# Patient Record
Sex: Female | Born: 1990 | Race: Black or African American | Hispanic: No | Smoking: Never smoker
Health system: Southern US, Community
[De-identification: ages and names within clinical notes are randomized; demographics above are authoritative.]

## PROBLEM LIST (undated history)

## (undated) DIAGNOSIS — D649 Anemia, unspecified: Secondary | ICD-10-CM

## (undated) DIAGNOSIS — R87619 Unspecified abnormal cytological findings in specimens from cervix uteri: Secondary | ICD-10-CM

## (undated) DIAGNOSIS — F32A Depression, unspecified: Secondary | ICD-10-CM

## (undated) DIAGNOSIS — A749 Chlamydial infection, unspecified: Secondary | ICD-10-CM

## (undated) DIAGNOSIS — F329 Major depressive disorder, single episode, unspecified: Secondary | ICD-10-CM

## (undated) DIAGNOSIS — N72 Inflammatory disease of cervix uteri: Secondary | ICD-10-CM

## (undated) DIAGNOSIS — N946 Dysmenorrhea, unspecified: Secondary | ICD-10-CM

## (undated) DIAGNOSIS — R011 Cardiac murmur, unspecified: Secondary | ICD-10-CM

## (undated) DIAGNOSIS — F419 Anxiety disorder, unspecified: Secondary | ICD-10-CM

## (undated) HISTORY — DX: Depression, unspecified: F32.A

## (undated) HISTORY — DX: Major depressive disorder, single episode, unspecified: F32.9

## (undated) HISTORY — PX: OTHER SURGICAL HISTORY: SHX169

## (undated) HISTORY — DX: Unspecified abnormal cytological findings in specimens from cervix uteri: R87.619

## (undated) HISTORY — PX: LEEP: SHX91

## (undated) HISTORY — DX: Anxiety disorder, unspecified: F41.9

---
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
## 2010-03-31 HISTORY — PX: WISDOM TOOTH EXTRACTION: SHX21

## 2010-06-18 DIAGNOSIS — N946 Dysmenorrhea, unspecified: Secondary | ICD-10-CM | POA: Insufficient documentation

## 2010-06-20 DIAGNOSIS — Z8639 Personal history of other endocrine, nutritional and metabolic disease: Secondary | ICD-10-CM | POA: Insufficient documentation

## 2011-11-10 ENCOUNTER — Inpatient Hospital Stay (HOSPITAL_COMMUNITY)
Admission: AD | Admit: 2011-11-10 | Discharge: 2011-11-10 | Disposition: A | Discharge status: Home / Self Care | Payer: Self-pay | Source: Ambulatory Visit | Attending: Obstetrics and Gynecology | Admitting: Obstetrics and Gynecology

## 2011-11-10 ENCOUNTER — Encounter (HOSPITAL_COMMUNITY): Payer: Self-pay | Admitting: *Deleted

## 2011-11-10 DIAGNOSIS — N939 Abnormal uterine and vaginal bleeding, unspecified: Secondary | ICD-10-CM

## 2011-11-10 DIAGNOSIS — N949 Unspecified condition associated with female genital organs and menstrual cycle: Secondary | ICD-10-CM | POA: Insufficient documentation

## 2011-11-10 DIAGNOSIS — N72 Inflammatory disease of cervix uteri: Secondary | ICD-10-CM | POA: Insufficient documentation

## 2011-11-10 DIAGNOSIS — N938 Other specified abnormal uterine and vaginal bleeding: Secondary | ICD-10-CM | POA: Insufficient documentation

## 2011-11-10 HISTORY — DX: Chlamydial infection, unspecified: A74.9

## 2011-11-10 HISTORY — DX: Cardiac murmur, unspecified: R01.1

## 2011-11-10 LAB — URINALYSIS, ROUTINE W REFLEX MICROSCOPIC
Bilirubin Urine: NEGATIVE
Hgb urine dipstick: NEGATIVE
Ketones, ur: NEGATIVE mg/dL
Protein, ur: NEGATIVE mg/dL
Urobilinogen, UA: 0.2 mg/dL (ref 0.0–1.0)

## 2011-11-10 LAB — WET PREP, GENITAL
Trich, Wet Prep: NONE SEEN
Yeast Wet Prep HPF POC: NONE SEEN

## 2011-11-10 MED ORDER — AZITHROMYCIN 250 MG PO TABS
1000.0000 mg | ORAL_TABLET | Freq: Once | ORAL | Status: AC
  Administered 2011-11-10: 1000 mg via ORAL
  Filled 2011-11-10: qty 4

## 2011-11-10 NOTE — MAU Provider Note (Signed)
History     CSN: [id number]  Arrival date and time: 11/10/11 0856   None     Chief Complaint  Patient presents with  . Vaginal Bleeding   HPI Connie Guzman is a 21 y.o. female who presents to MAU with vaginal bleeding. The bleeding started this morning after intercourse. Patient state intercourse was not painful. She describes the bleeding as light, a streak of blood. Associated symptoms include lower abdominal cramping and nausea.  Last pap smear one year ago and normal. LMP 11/05/11. Current sex partner x 3 years. No birth control. Hx of chlamydia.  OB History    Grav Para Term Preterm Abortions TAB SAB Ect Mult Living                  Past Medical History  Diagnosis Date  . Heart murmur   . Chlamydia     Past Surgical History  Procedure Date  . Wisdom tooth extraction 2012  . Tonsillectomy and adenoidectomy 2009  . Tubes in ears     Family History  Problem Relation Age of Onset  . Other Neg Hx     History  Substance Use Topics  . Smoking status: Never Smoker   . Smokeless tobacco: Not on file  . Alcohol Use: 0.6 oz/week    1 Glasses of wine per week     last time about 1 week ago    Allergies:  Allergies  Allergen Reactions  . Chocolate Other (See Comments)    Throat swells and gets scratchy.  Face breaks out in pimples    No prescriptions prior to admission    Review of Systems  Constitutional: Negative for fever, chills and weight loss.  HENT: Negative for ear pain, nosebleeds, congestion, sore throat and neck pain.   Eyes: Negative for blurred vision, double vision, photophobia and pain.  Respiratory: Negative for cough, shortness of breath and wheezing.   Cardiovascular: Negative for chest pain, palpitations and leg swelling.  Gastrointestinal: Positive for nausea, abdominal pain (cramping) and constipation. Negative for heartburn, vomiting and diarrhea.  Genitourinary: Negative for dysuria, urgency and frequency.  Musculoskeletal: Positive for  back pain. Negative for myalgias.  Skin: Negative for itching and rash.  Neurological: Negative for dizziness, sensory change, speech change, seizures, weakness and headaches.  Endo/Heme/Allergies: Does not bruise/bleed easily.  Psychiatric/Behavioral: Negative for depression. The patient is nervous/anxious (hx of anxiety and depression , no medications). The patient does not have insomnia.    Physical Exam   Blood pressure 114/70, pulse 77, temperature 98.5 F (36.9 C), temperature source Oral, resp. rate 18, height 5\' 6"  (1.676 m), weight 120 lb (54.432 kg), last menstrual period 11/05/2011.  Physical Exam  Constitutional: She is oriented to person, place, and time. She appears well-developed and well-nourished. No distress.  HENT:  Head: Normocephalic and atraumatic.  Eyes: EOM are normal.  Neck: Neck supple.  Cardiovascular: Normal rate.   Respiratory: Effort normal.  GI: Soft.       Minimal tenderness lower abdomen with deep palpation. No guarding or rebound.  Genitourinary:       External genitalia without lesions. Scant blood vaginal vault. Cervix inflamed, positive CMT, no adnexal tenderness or mass palpated. Uterus without palpable enlargement.  Musculoskeletal: Normal range of motion.  Neurological: She is alert and oriented to person, place, and time.  Skin: Skin is warm and dry.  Psychiatric: She has a normal mood and affect. Her behavior is normal. Judgment and thought content normal.   Results  for orders placed during the hospital encounter of 11/10/11 (from the past 24 hour(s))  URINALYSIS, ROUTINE W REFLEX MICROSCOPIC     Status: Normal   Collection Time   11/10/11  9:21 AM      Component Value Range   Color, Urine YELLOW  YELLOW   APPearance CLEAR  CLEAR   Specific Gravity, Urine 1.025  1.005 - 1.030   pH 6.0  5.0 - 8.0   Glucose, UA NEGATIVE  NEGATIVE mg/dL   Hgb urine dipstick NEGATIVE  NEGATIVE   Bilirubin Urine NEGATIVE  NEGATIVE   Ketones, ur NEGATIVE   NEGATIVE mg/dL   Protein, ur NEGATIVE  NEGATIVE mg/dL   Urobilinogen, UA 0.2  0.0 - 1.0 mg/dL   Nitrite NEGATIVE  NEGATIVE   Leukocytes, UA NEGATIVE  NEGATIVE  POCT PREGNANCY, URINE     Status: Normal   Collection Time   11/10/11  9:31 AM      Component Value Range   Preg Test, Ur NEGATIVE  NEGATIVE  WET PREP, GENITAL     Status: Abnormal   Collection Time   11/10/11 10:10 AM      Component Value Range   Yeast Wet Prep HPF POC NONE SEEN  NONE SEEN   Trich, Wet Prep NONE SEEN  NONE SEEN   Clue Cells Wet Prep HPF POC FEW (*) NONE SEEN   WBC, Wet Prep HPF POC TOO NUMEROUS TO COUNT (*) NONE SEEN   MAU Course  Procedures Assessment: 21 y.o. female with abnormal vaginal bleeding   Cervicitis  Plan:  Zithromax 1 gram po   Follow up with [facility], return here as needed.  Medication List  As of 11/10/2011  7:02 PM   CONTINUE taking these medications         ibuprofen 200 MG tablet   Commonly known as: ADVIL,MOTRIN      naproxen 250 MG tablet   Commonly known as: NAPROSYN           Follow-up Information    Call [facility] [place].   Contact information:   [address] [facility] [address]         I have reviewed this patient's vital signs, nurses notes and appropriate labs. I have discussed results with the patient, treatment and follow up plan of care. Patient voices understanding.  NEESE,HOPE, RN, FNP, [facility] 11/10/2011, 10:11 AM

## 2011-11-10 NOTE — MAU Note (Signed)
Has a lot of concerns with her body.  Period ended 2 days. Had sex this morning, was a streak of bright red blood on the sheet, about 5 min later became nausea.  Period has been lighter and shorter and not as much cramping as usual.

## 2011-11-10 NOTE — MAU Note (Signed)
Pt states she just finished her menses on Sunday which lasted 4 days.  She got concerned because she started having some vaginal bleeding this morning at 0800.

## 2011-11-11 NOTE — MAU Provider Note (Signed)
Agree with above note.  Connie Guzman 11/11/2011 7:52 AM

## 2011-11-13 ENCOUNTER — Telehealth: Payer: Self-pay | Admitting: *Deleted

## 2011-11-13 ENCOUNTER — Other Ambulatory Visit: Payer: Self-pay | Admitting: Obstetrics and Gynecology

## 2011-11-13 MED ORDER — AZITHROMYCIN 500 MG PO TABS: 1000.0000 mg | ORAL_TABLET | Freq: Once | ORAL | Status: AC

## 2011-11-13 NOTE — Telephone Encounter (Signed)
Message copied by Pennie Banter on Thu Nov 13, 2011  4:47 PM ------      Message from: CONSTANT, Gigi Gin      Created: Thu Nov 13, 2011  9:32 AM      Regarding: STD       Please inform patient of positive chlamydia results and that azithromycin has been e-prescribed. Patient needs to follow-up with health department and inform partner that treatment is needed            [facility]

## 2011-11-13 NOTE — Telephone Encounter (Signed)
Telephone call to patient regarding positive chlamydia culture, patient notified.  Instructed patient to pick up Rx for treatment.  Instructed patient to notify her partner for treatment.  Instructed patient to abstain from sex for seven days after treatment.  Report of treatment faxed to health department.

## 2012-04-24 ENCOUNTER — Emergency Department (HOSPITAL_COMMUNITY)
Admission: EM | Admit: 2012-04-24 | Discharge: 2012-04-24 | Disposition: A | Discharge status: Home / Self Care | Payer: Self-pay | Attending: Emergency Medicine | Admitting: Emergency Medicine

## 2012-04-24 ENCOUNTER — Encounter (HOSPITAL_COMMUNITY): Payer: Self-pay | Admitting: Emergency Medicine

## 2012-04-24 DIAGNOSIS — N72 Inflammatory disease of cervix uteri: Secondary | ICD-10-CM | POA: Insufficient documentation

## 2012-04-24 DIAGNOSIS — N390 Urinary tract infection, site not specified: Secondary | ICD-10-CM | POA: Insufficient documentation

## 2012-04-24 DIAGNOSIS — N760 Acute vaginitis: Secondary | ICD-10-CM | POA: Insufficient documentation

## 2012-04-24 DIAGNOSIS — Z3202 Encounter for pregnancy test, result negative: Secondary | ICD-10-CM | POA: Insufficient documentation

## 2012-04-24 DIAGNOSIS — B9689 Other specified bacterial agents as the cause of diseases classified elsewhere: Secondary | ICD-10-CM

## 2012-04-24 DIAGNOSIS — N898 Other specified noninflammatory disorders of vagina: Secondary | ICD-10-CM | POA: Insufficient documentation

## 2012-04-24 DIAGNOSIS — R011 Cardiac murmur, unspecified: Secondary | ICD-10-CM | POA: Insufficient documentation

## 2012-04-24 DIAGNOSIS — Z79899 Other long term (current) drug therapy: Secondary | ICD-10-CM | POA: Insufficient documentation

## 2012-04-24 DIAGNOSIS — R110 Nausea: Secondary | ICD-10-CM | POA: Insufficient documentation

## 2012-04-24 DIAGNOSIS — Z8619 Personal history of other infectious and parasitic diseases: Secondary | ICD-10-CM | POA: Insufficient documentation

## 2012-04-24 DIAGNOSIS — N949 Unspecified condition associated with female genital organs and menstrual cycle: Secondary | ICD-10-CM | POA: Insufficient documentation

## 2012-04-24 DIAGNOSIS — Z8742 Personal history of other diseases of the female genital tract: Secondary | ICD-10-CM | POA: Insufficient documentation

## 2012-04-24 DIAGNOSIS — R42 Dizziness and giddiness: Secondary | ICD-10-CM | POA: Insufficient documentation

## 2012-04-24 HISTORY — DX: Inflammatory disease of cervix uteri: N72

## 2012-04-24 LAB — URINALYSIS, ROUTINE W REFLEX MICROSCOPIC
Ketones, ur: NEGATIVE mg/dL
Leukocytes, UA: NEGATIVE
Protein, ur: NEGATIVE mg/dL
Urobilinogen, UA: 0.2 mg/dL (ref 0.0–1.0)

## 2012-04-24 LAB — WET PREP, GENITAL

## 2012-04-24 LAB — URINE MICROSCOPIC-ADD ON

## 2012-04-24 LAB — POCT PREGNANCY, URINE: Preg Test, Ur: NEGATIVE

## 2012-04-24 MED ORDER — HYDROCODONE-ACETAMINOPHEN 5-325 MG PO TABS
1.0000 | ORAL_TABLET | Freq: Once | ORAL | Status: AC
  Administered 2012-04-24: 1 via ORAL
  Filled 2012-04-24: qty 1

## 2012-04-24 MED ORDER — NITROFURANTOIN MONOHYD MACRO 100 MG PO CAPS: 100.0000 mg | ORAL_CAPSULE | Freq: Two times a day (BID) | ORAL | Status: DC

## 2012-04-24 MED ORDER — METRONIDAZOLE 500 MG PO TABS: 500.0000 mg | ORAL_TABLET | Freq: Two times a day (BID) | ORAL | Status: DC

## 2012-04-24 NOTE — ED Provider Notes (Signed)
Medical screening examination/treatment/procedure(s) were performed by non-physician practitioner and as supervising physician I was immediately available for consultation/collaboration.   Loren Racer, MD 04/24/12 [phone]

## 2012-04-24 NOTE — ED Notes (Signed)
Family at bedside. 

## 2012-04-24 NOTE — ED Provider Notes (Signed)
Pelvic exam: VAGINA: normal appearing vagina with normal color and discharge, no lesions, vaginal discharge - white, curd-like and odorless, CERVIX: cervical discharge present - white, curd-like and thick, cervical motion tenderness absent, ADNEXA: tenderness right, mild, no masses, exam chaperoned by technician.    Jaci Carrel, [place] 04/24/12 2035

## 2012-04-24 NOTE — ED Notes (Addendum)
Patient complaining of intermittent abdominal cramping for the past two weeks; cramping worsens during night and after sexual activity.  Patient has recently had pregnancy test done -- was told by PCP to take another one in a week.  LMP 03/10/12.  Patient denies abnormal vaginal discharge.  Complaining of nausea and dizziness that started today.

## 2012-04-24 NOTE — ED Provider Notes (Signed)
History     CSN: [id number]  Arrival date & time 04/24/12  1308   First MD Initiated Contact with Patient 04/24/12 1939      Chief Complaint  Patient presents with  . Abdominal Cramping    (Consider location/radiation/quality/duration/timing/severity/associated sxs/prior treatment) HPI Comments: 22 y/o F h/o chlamydia p/w suprapubic cramping and vaginal discharge. 2 weeks. Progressively worsening. Some nausea today. Brief LH with pain. Resolved.  Patient is a 22 y.o. female presenting with cramps.  Abdominal Cramping The primary symptoms of the illness include abdominal pain, nausea and vaginal discharge. The primary symptoms of the illness do not include fever, fatigue, shortness of breath, vomiting, diarrhea, hematochezia, dysuria or vaginal bleeding. The current episode started more than 2 days ago. The onset of the illness was gradual. The problem has been gradually worsening.  The abdominal pain began more than 2 days ago. The pain came on gradually. The abdominal pain has been gradually worsening since its onset. The abdominal pain is located in the suprapubic region. The abdominal pain does not radiate. Pain scale: moderate. The abdominal pain is relieved by nothing.  The vaginal discharge was first noticed more than 2 days ago. The patient believes that the vaginal discharge is worsening since it began. The vaginal discharge is not associated with dysuria.  Symptoms associated with the illness do not include chills, hematuria or back pain.    Past Medical History  Diagnosis Date  . Heart murmur   . Chlamydia   . Inflammation of cervix     Past Surgical History  Procedure Date  . Wisdom tooth extraction 2012  . Tonsillectomy and adenoidectomy 2009  . Tubes in ears     Family History  Problem Relation Age of Onset  . Other Neg Hx     History  Substance Use Topics  . Smoking status: Never Smoker   . Smokeless tobacco: Not on file  . Alcohol Use: 0.6 oz/week    1  Glasses of wine per week     Comment: last time about 1 week ago    OB History    Grav Para Term Preterm Abortions TAB SAB Ect Mult Living                  Review of Systems  Constitutional: Negative for fever, chills and fatigue.  HENT: Negative for congestion and rhinorrhea.   Eyes: Negative for pain and visual disturbance.  Respiratory: Negative for cough and shortness of breath.   Cardiovascular: Negative for chest pain and leg swelling.  Gastrointestinal: Positive for nausea and abdominal pain. Negative for vomiting, diarrhea and hematochezia.  Genitourinary: Positive for vaginal discharge and pelvic pain. Negative for dysuria, hematuria, flank pain, vaginal bleeding and difficulty urinating.  Musculoskeletal: Negative for back pain.  Skin: Negative for color change and rash.  Neurological: Positive for light-headedness. Negative for dizziness and headaches.  All other systems reviewed and are negative.    Allergies  Chocolate  Home Medications   Current Outpatient Rx  Name  Route  Sig  Dispense  Refill  . ACETAMINOPHEN 500 MG PO CHEW   Oral   Chew 500 mg by mouth every 6 (six) hours as needed. For pain         . IBUPROFEN 200 MG PO TABS   Oral   Take 200 mg by mouth every 6 (six) hours as needed. For cramps.         Marland Kitchen NAPROXEN 250 MG PO TABS   Oral  Take 250 mg by mouth daily as needed. For cramps.         Marland Kitchen METRONIDAZOLE 500 MG PO TABS   Oral   Take 1 tablet (500 mg total) by mouth 2 (two) times daily.   14 tablet   0   . NITROFURANTOIN MONOHYD MACRO 100 MG PO CAPS   Oral   Take 1 capsule (100 mg total) by mouth 2 (two) times daily.   10 capsule   0     BP 110/42  Pulse 81  Temp 98.4 F (36.9 C) (Oral)  Resp 16  SpO2 99%  LMP 03/10/2012  Physical Exam  Nursing note and vitals reviewed. Constitutional: She is oriented to person, place, and time. She appears well-developed and well-nourished. No distress.  HENT:  Head: Normocephalic  and atraumatic.  Eyes: Conjunctivae normal and EOM are normal. Pupils are equal, round, and reactive to light. Right eye exhibits no discharge. Left eye exhibits no discharge.  Neck: No tracheal deviation present.  Cardiovascular: Normal heart sounds and intact distal pulses.   Pulmonary/Chest: Effort normal and breath sounds normal. No stridor. No respiratory distress. She has no wheezes. She has no rales.  Abdominal: Soft. She exhibits no distension. There is tenderness (minimal suprapubic tenderness). There is no guarding.  Musculoskeletal: She exhibits no edema and no tenderness.  Neurological: She is alert and oriented to person, place, and time. She has normal strength. No cranial nerve deficit or sensory deficit. GCS eye subscore is 4. GCS verbal subscore is 5. GCS motor subscore is 6.  Skin: Skin is warm and dry.  Psychiatric: She has a normal mood and affect. Her behavior is normal.    ED Course  Procedures (including critical care time)  Labs Reviewed  URINALYSIS, ROUTINE W REFLEX MICROSCOPIC - Abnormal; Notable for the following:    APPearance CLOUDY (*)     Nitrite POSITIVE (*)     All other components within normal limits  WET PREP, GENITAL - Abnormal; Notable for the following:    Clue Cells Wet Prep HPF POC FEW (*)     WBC, Wet Prep HPF POC MODERATE (*)     All other components within normal limits  URINE MICROSCOPIC-ADD ON - Abnormal; Notable for the following:    Squamous Epithelial / LPF FEW (*)     Bacteria, UA MANY (*)     All other components within normal limits  POCT PREGNANCY, URINE  GC/CHLAMYDIA PROBE AMP   No results found.   1. UTI (lower urinary tract infection)   2. Bacterial vaginosis       MDM    22 y/o F with lower abd cramping. Onset 2 weeks ago. No fever HDS Labs as above. C/w BV and UTI Treat with flagyl and macrobid.  Doubt Appendicitis, Bowel Obstruction, AAA, Pyelonephritis, Nephrolithiasis, Pancreatitis, Cholecystitis, Shingles,  Perforated Bowel or Ulcer, Diverticulosis/itis, Ischemic Mesentery, Inflammatory Bowel Disease, Strangulated/Incarcerated Hernia,  PID, Intrauterine Pregnancy, Ectopic Pregnancy, Ovarian Torsion, Tubal Ovarian Abscess, STD, or other abdominal emergency as this would be inconsistent with history and physical, low risk, other diagnosis much more likely.   Dc home, return precautions given, follow up with primary care provider, patient in agreement with plan.   Labs and imaging reviewed by myself and considered in medical decision making if ordered. Imaging interpreted by radiology.   Discussed case with Dr. Ranae Palms who is in agreement with assessment and plan.       Stevie Kern, MD 04/24/12 [phone]

## 2012-04-24 NOTE — ED Provider Notes (Signed)
I saw and evaluated the patient, reviewed the resident's note and I agree with the findings and plan.   Loren Racer, MD 04/24/12 [phone]

## 2012-04-27 ENCOUNTER — Encounter (HOSPITAL_COMMUNITY): Payer: Self-pay | Admitting: *Deleted

## 2012-04-27 ENCOUNTER — Emergency Department (HOSPITAL_COMMUNITY)
Admission: EM | Admit: 2012-04-27 | Discharge: 2012-04-27 | Disposition: A | Discharge status: Home / Self Care | Payer: Self-pay | Attending: Emergency Medicine | Admitting: Emergency Medicine

## 2012-04-27 DIAGNOSIS — Z8619 Personal history of other infectious and parasitic diseases: Secondary | ICD-10-CM | POA: Insufficient documentation

## 2012-04-27 DIAGNOSIS — N898 Other specified noninflammatory disorders of vagina: Secondary | ICD-10-CM | POA: Insufficient documentation

## 2012-04-27 DIAGNOSIS — Z8742 Personal history of other diseases of the female genital tract: Secondary | ICD-10-CM | POA: Insufficient documentation

## 2012-04-27 DIAGNOSIS — R011 Cardiac murmur, unspecified: Secondary | ICD-10-CM | POA: Insufficient documentation

## 2012-04-27 DIAGNOSIS — Z3202 Encounter for pregnancy test, result negative: Secondary | ICD-10-CM | POA: Insufficient documentation

## 2012-04-27 DIAGNOSIS — R110 Nausea: Secondary | ICD-10-CM | POA: Insufficient documentation

## 2012-04-27 DIAGNOSIS — Z79899 Other long term (current) drug therapy: Secondary | ICD-10-CM | POA: Insufficient documentation

## 2012-04-27 DIAGNOSIS — N939 Abnormal uterine and vaginal bleeding, unspecified: Secondary | ICD-10-CM

## 2012-04-27 DIAGNOSIS — R109 Unspecified abdominal pain: Secondary | ICD-10-CM | POA: Insufficient documentation

## 2012-04-27 LAB — CBC WITH DIFFERENTIAL/PLATELET
Basophils Absolute: 0 10*3/uL (ref 0.0–0.1)
Basophils Relative: 0 % (ref 0–1)
Eosinophils Absolute: 0 10*3/uL (ref 0.0–0.7)
Eosinophils Relative: 1 % (ref 0–5)
HCT: 40.7 % (ref 36.0–46.0)
Lymphocytes Relative: 51 % — ABNORMAL HIGH (ref 12–46)
MCHC: 33.7 g/dL (ref 30.0–36.0)
MCV: 85.1 fL (ref 78.0–100.0)
Monocytes Absolute: 0.5 10*3/uL (ref 0.1–1.0)
Platelets: 229 10*3/uL (ref 150–400)
RDW: 12.4 % (ref 11.5–15.5)
WBC: 5.1 10*3/uL (ref 4.0–10.5)

## 2012-04-27 LAB — URINALYSIS, MICROSCOPIC ONLY
Bilirubin Urine: NEGATIVE
Glucose, UA: NEGATIVE mg/dL
Ketones, ur: NEGATIVE mg/dL
pH: 6 (ref 5.0–8.0)

## 2012-04-27 LAB — GC/CHLAMYDIA PROBE AMP: GC Probe RNA: NEGATIVE

## 2012-04-27 LAB — LIPASE, BLOOD: Lipase: 36 U/L (ref 11–59)

## 2012-04-27 MED ORDER — PROMETHAZINE HCL 25 MG PO TABS: 25.0000 mg | ORAL_TABLET | Freq: Four times a day (QID) | ORAL | Status: DC | PRN

## 2012-04-27 MED ORDER — ONDANSETRON HCL 4 MG/2ML IJ SOLN
4.0000 mg | Freq: Once | INTRAMUSCULAR | Status: AC
  Administered 2012-04-27: 4 mg via INTRAVENOUS
  Filled 2012-04-27: qty 2

## 2012-04-27 MED ORDER — OXYCODONE-ACETAMINOPHEN 5-325 MG PO TABS: 1.0000 | ORAL_TABLET | ORAL | Status: DC | PRN

## 2012-04-27 NOTE — ED Notes (Signed)
Pt from home with reports of lower abdominal cramping, more on the right as well as nausea that started around 0430 this morning. Pt reports last period was December 11 and when asked if this could be her period, she stated, "it could be but I just want to be sure".

## 2012-04-27 NOTE — ED Notes (Signed)
Pt reports that she was seen at [facility] a few days ago, treated for UTI and BV. States current pain is "similar" to previous pain, this pain is "milder"

## 2012-04-27 NOTE — ED Provider Notes (Signed)
History     CSN: [id number]  Arrival date & time 04/27/12  4098   First MD Initiated Contact with Patient 04/27/12 [phone]      Chief Complaint  Patient presents with  . Abdominal Pain    lower, right  . Nausea    (Consider location/radiation/quality/duration/timing/severity/associated sxs/prior treatment) Patient is a 22 y.o. female presenting with abdominal pain. The history is provided by the patient. No language interpreter was used.  Abdominal Pain The primary symptoms of the illness include abdominal pain, nausea and vaginal bleeding. The primary symptoms of the illness do not include fever, fatigue, shortness of breath, vomiting, diarrhea, hematemesis, hematochezia, dysuria or vaginal discharge. The current episode started 3 to 5 hours ago. The onset of the illness was sudden. The problem has been gradually improving.  The illness is associated with recent antibiotic use. The patient states that she believes she is currently not pregnant. The patient has not had a change in bowel habit. Symptoms associated with the illness do not include chills, anorexia, diaphoresis, heartburn, constipation, urgency, hematuria, frequency or back pain.    Past Medical History  Diagnosis Date  . Heart murmur   . Chlamydia   . Inflammation of cervix     Past Surgical History  Procedure Date  . Wisdom tooth extraction 2012  . Tonsillectomy and adenoidectomy 2009  . Tubes in ears     Family History  Problem Relation Age of Onset  . Other Neg Hx     History  Substance Use Topics  . Smoking status: Never Smoker   . Smokeless tobacco: Never Used  . Alcohol Use: 0.6 oz/week    1 Glasses of wine per week     Comment: last time about 1 week ago    OB History    Grav Para Term Preterm Abortions TAB SAB Ect Mult Living                  Review of Systems  Constitutional: Negative for fever, chills, diaphoresis and fatigue.  Respiratory: Negative for shortness of breath.     Gastrointestinal: Positive for nausea and abdominal pain. Negative for heartburn, vomiting, diarrhea, constipation, hematochezia, anorexia and hematemesis.  Genitourinary: Positive for vaginal bleeding. Negative for dysuria, urgency, frequency, hematuria and vaginal discharge.  Musculoskeletal: Negative for back pain.    Allergies  Chocolate  Home Medications   Current Outpatient Rx  Name  Route  Sig  Dispense  Refill  . METRONIDAZOLE 500 MG PO TABS   Oral   Take 1 tablet (500 mg total) by mouth 2 (two) times daily.   14 tablet   0   . NITROFURANTOIN MONOHYD MACRO 100 MG PO CAPS   Oral   Take 1 capsule (100 mg total) by mouth 2 (two) times daily.   10 capsule   0     BP 106/65  Pulse 84  Temp 98.1 F (36.7 C) (Oral)  Resp 16  SpO2 100%  LMP 03/10/2012  Physical Exam  Nursing note and vitals reviewed. Constitutional: She appears well-developed and well-nourished. No distress.  HENT:  Head: Normocephalic and atraumatic.  Right Ear: External ear normal.  Left Ear: External ear normal.  Nose: Nose normal.  Mouth/Throat: Oropharynx is clear and moist. No oropharyngeal exudate.  Eyes: Conjunctivae normal are normal. Pupils are equal, round, and reactive to light. Right eye exhibits no discharge. Left eye exhibits no discharge. No scleral icterus.  Neck: Normal range of motion. Neck supple. No JVD present. No  tracheal deviation present.  Cardiovascular: Normal rate, regular rhythm, normal heart sounds and intact distal pulses.  Exam reveals no gallop and no friction rub.   No murmur heard. Pulmonary/Chest: Effort normal and breath sounds normal. No stridor. No respiratory distress. She has no wheezes. She has no rales. She exhibits no tenderness.  Abdominal: Soft. Bowel sounds are normal. She exhibits no distension and no mass. There is tenderness (nonfocal lower abd). There is no rebound and no guarding. Hernia confirmed negative in the right inguinal area and confirmed  negative in the left inguinal area.  Genitourinary: Uterus normal. Rectal exam shows external hemorrhoid. Pelvic exam was performed with patient supine. No labial fusion. There is no rash, tenderness, lesion or injury on the right labia. There is no rash, tenderness, lesion or injury on the left labia. Uterus is not deviated and not fixed. Cervix exhibits no motion tenderness, no discharge and no friability. Right adnexum displays no mass, no tenderness and no fullness. Left adnexum displays no mass, no tenderness and no fullness. There is bleeding around the vagina. No erythema or tenderness around the vagina. No foreign body around the vagina. No signs of injury around the vagina. No vaginal discharge found.  Musculoskeletal: Normal range of motion. She exhibits no edema and no tenderness.  Lymphadenopathy:    She has no cervical adenopathy.       Right: No inguinal adenopathy present.       Left: No inguinal adenopathy present.  Neurological: She is alert.  Skin: Skin is warm and dry. No rash noted. She is not diaphoretic. No erythema. No pallor.  Psychiatric: She has a normal mood and affect. Her behavior is normal.    ED Course  Procedures (including critical care time)   Labs Reviewed  POCT PREGNANCY, URINE  CBC WITH DIFFERENTIAL  LIPASE, BLOOD  URINALYSIS, MICROSCOPIC ONLY   No results found.   No diagnosis found.    MDM  Pt presents for evaluation of abdominal cramping and vaginal bleeding.  She appears nontoxic, note stable VS, NAD.  Her LNMP was 12/11.  She was seen in the ER on 1/25 for similar discomfort minus the vaginal bleeding.  She was eventually diagnosed with BV and a UTI.  She has been taking the antibiotic.  She has no evidence peritonitis.  4540.  Pt stable, NAD.  She has a small amount of vaginal bleeding but no significant pelvic pain or evidence of cervicitis.  She has no anemia or leukocytosis on CBC.  At this time, based on her previous evaluation and the  findings today, no further testing will be performed.  If the pain worsens she will be instructed to return to the ER for a repeat belly exam at which time imaging can be performed.  Currently her exam does not point towards an acute surgical issue or infection.  I will encourage follow-up with a primary physician and gynecologist also.       Tobin Chad, MD 04/27/12 [phone]

## 2012-05-15 ENCOUNTER — Other Ambulatory Visit: Payer: Self-pay

## 2012-07-31 ENCOUNTER — Emergency Department (HOSPITAL_COMMUNITY)
Admission: EM | Admit: 2012-07-31 | Discharge: 2012-07-31 | Disposition: A | Discharge status: Home / Self Care | Payer: Self-pay | Attending: Emergency Medicine | Admitting: Emergency Medicine

## 2012-07-31 ENCOUNTER — Encounter (HOSPITAL_COMMUNITY): Payer: Self-pay | Admitting: Emergency Medicine

## 2012-07-31 DIAGNOSIS — Z8619 Personal history of other infectious and parasitic diseases: Secondary | ICD-10-CM | POA: Insufficient documentation

## 2012-07-31 DIAGNOSIS — R112 Nausea with vomiting, unspecified: Secondary | ICD-10-CM | POA: Insufficient documentation

## 2012-07-31 DIAGNOSIS — N898 Other specified noninflammatory disorders of vagina: Secondary | ICD-10-CM | POA: Insufficient documentation

## 2012-07-31 DIAGNOSIS — N946 Dysmenorrhea, unspecified: Secondary | ICD-10-CM | POA: Insufficient documentation

## 2012-07-31 DIAGNOSIS — N739 Female pelvic inflammatory disease, unspecified: Secondary | ICD-10-CM | POA: Insufficient documentation

## 2012-07-31 DIAGNOSIS — R011 Cardiac murmur, unspecified: Secondary | ICD-10-CM | POA: Insufficient documentation

## 2012-07-31 DIAGNOSIS — Z79899 Other long term (current) drug therapy: Secondary | ICD-10-CM | POA: Insufficient documentation

## 2012-07-31 LAB — URINALYSIS, ROUTINE W REFLEX MICROSCOPIC
Leukocytes, UA: NEGATIVE
Protein, ur: NEGATIVE mg/dL
Urobilinogen, UA: 0.2 mg/dL (ref 0.0–1.0)

## 2012-07-31 LAB — URINE MICROSCOPIC-ADD ON

## 2012-07-31 MED ORDER — DOXYCYCLINE HYCLATE 100 MG PO CAPS: 100.0000 mg | ORAL_CAPSULE | Freq: Two times a day (BID) | ORAL | Status: DC

## 2012-07-31 MED ORDER — HYDROCODONE-ACETAMINOPHEN 5-325 MG PO TABS
1.0000 | ORAL_TABLET | Freq: Once | ORAL | Status: AC
  Administered 2012-07-31: 1 via ORAL
  Filled 2012-07-31: qty 1

## 2012-07-31 MED ORDER — ONDANSETRON HCL 8 MG PO TABS: 4.0000 mg | ORAL_TABLET | Freq: Once | ORAL | Status: DC

## 2012-07-31 MED ORDER — LIDOCAINE HCL (PF) 1 % IJ SOLN
INTRAMUSCULAR | Status: AC
  Filled 2012-07-31: qty 5

## 2012-07-31 MED ORDER — CEFTRIAXONE SODIUM 250 MG IJ SOLR
250.0000 mg | Freq: Once | INTRAMUSCULAR | Status: AC
  Administered 2012-07-31: 250 mg via INTRAMUSCULAR
  Filled 2012-07-31: qty 250

## 2012-07-31 MED ORDER — ONDANSETRON 4 MG PO TBDP
ORAL_TABLET | ORAL | Status: AC
  Administered 2012-07-31: 4 mg via ORAL
  Filled 2012-07-31: qty 1

## 2012-07-31 MED ORDER — ONDANSETRON 4 MG PO TBDP
4.0000 mg | ORAL_TABLET | Freq: Once | ORAL | Status: AC
  Administered 2012-07-31: 4 mg via ORAL

## 2012-07-31 NOTE — ED Provider Notes (Signed)
History     CSN: [id number]  Arrival date & time 07/31/12  1712   First MD Initiated Contact with Patient 07/31/12 1724      Chief Complaint  Patient presents with  . Abdominal Cramping    (Consider location/radiation/quality/duration/timing/severity/associated sxs/prior treatment) Patient is a 22 y.o. female presenting with cramps. The history is provided by the patient.  Abdominal Cramping This is a new problem. The current episode started today. The problem occurs constantly. The problem has been unchanged. Associated symptoms include abdominal pain, nausea and vomiting. Pertinent negatives include no chest pain, chills, coughing, fever or neck pain. Nothing (similar to previous menstrual periods) aggravates the symptoms.    Past Medical History  Diagnosis Date  . Heart murmur   . Chlamydia   . Inflammation of cervix     Past Surgical History  Procedure Laterality Date  . Wisdom tooth extraction  2012  . Tonsillectomy and adenoidectomy  2009  . Tubes in ears      Family History  Problem Relation Age of Onset  . Other Neg Hx     History  Substance Use Topics  . Smoking status: Never Smoker   . Smokeless tobacco: Never Used  . Alcohol Use: 0.6 oz/week    1 Glasses of wine per week     Comment: last 07/17/2012    OB History   Grav Para Term Preterm Abortions TAB SAB Ect Mult Living                  Review of Systems  Constitutional: Negative for fever, chills, activity change and appetite change.  HENT: Negative for neck pain and neck stiffness.   Respiratory: Negative for cough, chest tightness, shortness of breath and wheezing.   Cardiovascular: Negative for chest pain and palpitations.  Gastrointestinal: Positive for nausea, vomiting and abdominal pain. Negative for diarrhea and constipation.  Genitourinary: Positive for vaginal discharge. Negative for dysuria, hematuria, decreased urine volume, vaginal bleeding, difficulty urinating, vaginal pain and  menstrual problem.  Skin: Negative for wound.  Neurological: Negative for seizures, syncope, facial asymmetry and light-headedness.  Psychiatric/Behavioral: Negative for confusion and agitation.  All other systems reviewed and are negative.    Allergies  Chocolate  Home Medications   Current Outpatient Rx  Name  Route  Sig  Dispense  Refill  . metroNIDAZOLE (FLAGYL) 500 MG tablet   Oral   Take 1 tablet (500 mg total) by mouth 2 (two) times daily.   14 tablet   0   . nitrofurantoin, macrocrystal-monohydrate, (MACROBID) 100 MG capsule   Oral   Take 1 capsule (100 mg total) by mouth 2 (two) times daily.   10 capsule   0   . oxyCODONE-acetaminophen (PERCOCET) 5-325 MG per tablet   Oral   Take 1 tablet by mouth every 4 (four) hours as needed for pain.   12 tablet   0   . promethazine (PHENERGAN) 25 MG tablet   Oral   Take 1 tablet (25 mg total) by mouth every 6 (six) hours as needed for nausea.   12 tablet   0     LMP 07/01/2012  Physical Exam  Nursing note and vitals reviewed. Constitutional: She is oriented to person, place, and time. She appears well-developed and well-nourished.  HENT:  Head: Normocephalic and atraumatic.  Right Ear: External ear normal.  Left Ear: External ear normal.  Nose: Nose normal.  Mouth/Throat: Oropharynx is clear and moist. No oropharyngeal exudate.  Eyes: Conjunctivae are normal.  Pupils are equal, round, and reactive to light.  Neck: Normal range of motion. Neck supple.  Cardiovascular: Normal rate, regular rhythm, normal heart sounds and intact distal pulses.  Exam reveals no gallop and no friction rub.   No murmur heard. Pulmonary/Chest: Effort normal and breath sounds normal. No respiratory distress. She has no wheezes. She has no rales. She exhibits no tenderness.  Abdominal: Soft. Bowel sounds are normal. She exhibits no distension and no mass. There is tenderness (mild tenderness diffusely; moderate tenderness overlying  bilateral lower quadrants). There is no rebound and no guarding.  Genitourinary: Vaginal discharge found.  Moderate amount of bleeding from cervical os   Musculoskeletal: Normal range of motion. She exhibits no edema and no tenderness.  Neurological: She is alert and oriented to person, place, and time.  Skin: Skin is warm and dry.  Psychiatric: She has a normal mood and affect. Her behavior is normal. Judgment and thought content normal.    ED Course  Procedures (including critical care time)  Labs Reviewed  URINALYSIS, ROUTINE W REFLEX MICROSCOPIC - Abnormal; Notable for the following:    APPearance CLOUDY (*)    Hgb urine dipstick LARGE (*)    All other components within normal limits  URINE MICROSCOPIC-ADD ON - Abnormal; Notable for the following:    Squamous Epithelial / LPF FEW (*)    Bacteria, UA MANY (*)    All other components within normal limits  WET PREP, GENITAL  GC/CHLAMYDIA PROBE AMP  POCT PREGNANCY, URINE   No results found.   1. Dysmenorrhea   2. Pelvic inflammatory disease       MDM  22 yo F w/hx of Chlamydia and dysmenorrhea presents for 1 day of generalized abdominal cramping and associated nausea. One episode of non-bloody, non-bilious emesis. Regular bowel movements. LMP 4 weeks ago. Soft abdomen without peritoneal signs. Tender diffusely, but worse overlying bilateral lower quadrants. Endorses recent vaginal discharge. Pelvic exam reveals patient is currently menstruating; cervical motion tenderness and bilateral adnexal tenderness. No ovarian fullness or fever.   Suspect PID and dysmenorrhea. Clinical picture not concerning for ovarian torsion, TOA, acute appendicitis, or obstruction. Will treat with IM Rocephin and course of Doxycyline and refer to Gynecology for dysmenorrhea. Patient given return precautions, including worsening of signs or symptoms.         Clemetine Marker, MD 07/31/12 2022

## 2012-07-31 NOTE — ED Notes (Signed)
Received pt from home with c/o abdominal cramping with n/v onset today. Pt has history of painful menstrual cramps. Pt last menstrual period about 1 month ago.

## 2012-08-02 LAB — GC/CHLAMYDIA PROBE AMP: CT Probe RNA: NEGATIVE

## 2012-08-02 NOTE — ED Provider Notes (Signed)
I saw and evaluated the patient, reviewed the resident's note and I agree with the findings and plan.   .Face to face Exam:  General:  Awake HEENT:  Atraumatic Resp:  Normal effort Abd:  Nondistended Neuro:No focal weakness y  Nelia Shi, MD 08/02/12 [phone]

## 2012-08-06 ENCOUNTER — Other Ambulatory Visit (HOSPITAL_COMMUNITY)
Admission: RE | Admit: 2012-08-06 | Discharge: 2012-08-06 | Disposition: A | Discharge status: Home / Self Care | Payer: Self-pay | Source: Ambulatory Visit | Attending: Obstetrics and Gynecology | Admitting: Obstetrics and Gynecology

## 2012-08-06 ENCOUNTER — Ambulatory Visit (INDEPENDENT_AMBULATORY_CARE_PROVIDER_SITE_OTHER): Payer: Self-pay | Admitting: Women's Health

## 2012-08-06 ENCOUNTER — Encounter: Payer: Self-pay | Admitting: Women's Health

## 2012-08-06 VITALS — BP 98/60 | Ht 67.0 in | Wt 115.0 lb

## 2012-08-06 DIAGNOSIS — N926 Irregular menstruation, unspecified: Secondary | ICD-10-CM | POA: Insufficient documentation

## 2012-08-06 DIAGNOSIS — Z01419 Encounter for gynecological examination (general) (routine) without abnormal findings: Secondary | ICD-10-CM | POA: Insufficient documentation

## 2012-08-06 DIAGNOSIS — Z124 Encounter for screening for malignant neoplasm of cervix: Secondary | ICD-10-CM

## 2012-08-06 DIAGNOSIS — Z23 Encounter for immunization: Secondary | ICD-10-CM

## 2012-08-06 NOTE — Patient Instructions (Addendum)
Dysmenorrhea  Menstrual pain is caused by the muscles of the uterus tightening (contracting) during a menstrual period. The muscles of the uterus contract due to the chemicals in the uterine lining.  Primary dysmenorrhea is menstrual cramps that last a couple of days when you start having menstrual periods or soon after. This often begins after a teenager starts having her period. As a woman gets older or has a baby, the cramps will usually lesson or disappear.  Secondary dysmenorrhea begins later in life, lasts longer, and the pain may be stronger than primary dysmenorrhea. The pain may start before the period and last a few days after the period. This type of dysmenorrhea is usually caused by an underlying problem such as:  · The tissue lining the uterus grows outside of the uterus in other areas of the body (endometriosis).  · The endometrial tissue, which normally lines the uterus, is found in or grows into the muscular walls of the uterus (adenomyosis).  · The pelvic blood vessels are engorged with blood just before the menstrual period (pelvic congestive syndrome).  · Overgrowth of cells in the lining of the uterus or cervix (polyps of the uterus or cervix).  · Falling down of the uterus (prolapse) because of loose or stretched ligaments.  · Depression.  · Bladder problems, infection, or inflammation.  · Problems with the intestine, a tumor, or irritable bowel syndrome.  · Cancer of the female organs or bladder.  · A severely tipped uterus.  · A very tight opening or closed cervix.  · Noncancerous tumors of the uterus (fibroids).  · Pelvic inflammatory disease (PID).  · Pelvic scarring (adhesions) from a previous surgery.  · Ovarian cyst.  · An intrauterine device (IUD) used for birth control.  CAUSES   The cause of menstrual pain is often unknown.  SYMPTOMS   · Cramping or throbbing pain in your lower abdomen.  · Sometimes, a woman may also experience headaches.  · Lower back pain.  · Feeling sick to your  stomach (nausea) or vomiting.  · Diarrhea.  · Sweating or dizziness.  DIAGNOSIS   A diagnosis is based on your history, symptoms, physical examination, diagnostic tests, or procedures. Diagnostic tests or procedures may include:  · Blood tests.  · An ultrasound.  · An examination of the lining of the uterus (dilation and curettage, D&C).  · An examination inside your abdomen or pelvis with a scope (laparoscopy).  · X-rays.  · CT Scan.  · MRI.  · An examination inside the bladder with a scope (cystoscopy).  · An examination inside the intestine or stomach with a scope (colonoscopy, gastroscopy).  TREATMENT   Treatment depends on the cause of the dysmenorrhea. Treatment may include:  · Pain medicine prescribed by your caregiver.  · Birth control pills.  · Hormone replacement therapy.  · Nonsteroidal anti-inflammatory drugs (NSAIDs). These may help stop the production of prostaglandins.  · An IUD with progesterone hormone in it.  · Acupuncture.  · Surgery to remove adhesions, endometriosis, ovarian cyst, or fibroids.  · Removal of the uterus (hysterectomy).  · Progesterone shots to stop the menstrual period.  · Cutting the nerves on the sacrum that go to the female organs (presacral neurectomy).  · Electric currant to the sacral nerves (sacral nerve stimulation).  · Antidepressant medicine.  · Psychiatric therapy, counseling, or group therapy.  · Exercise and physical therapy.  · Meditation and yoga therapy.  HOME CARE INSTRUCTIONS   · Only take over-the-counter   or prescription medicines for pain, discomfort, or fever as directed by your caregiver.  · Place a heating pad or hot water bottle on your lower back or abdomen. Do not sleep with the heating pad.  · Use aerobic exercises, walking, swimming, biking, and other exercises to help lessen the cramping.  · Massage to the lower back or abdomen may help.  · Stop smoking.  · Avoid alcohol and caffeine.  · Yoga, meditation, or acupuncture may help.  SEEK MEDICAL CARE IF:    · The pain does not get better with medicine.  · You have pain with sexual intercourse.  SEEK IMMEDIATE MEDICAL CARE IF:   · Your pain increases and is not controlled with medicines.  · You have a fever.  · You develop nausea or vomiting with your period not controlled with medicine.  · You have abnormal vaginal bleeding with your period.  · You pass out.  MAKE SURE YOU:   · Understand these instructions.  · Will watch your condition.  · Will get help right away if you are not doing well or get worse.  Document Released: 03/17/2005 Document Revised: 06/09/2011 Document Reviewed: 07/03/2008  Exit[employer]® Patient Information ©2013 [employer].

## 2012-08-06 NOTE — Progress Notes (Signed)
Patient ID: Connie Guzman, female   DOB: [birth year], 22 y.o.   MRN: [id number] New patient presents with complaint of heavy, painful, irregular periods using no contraception. Periods occur every 30-40 days, last 5-7 days, heavy bleeding and cramping. Irregular since menarche at age 74. Tried birth control in past for cycle regulation but periods heavier, nausea. LMP 07/31/12. Denies urinary symptoms. [place] 07/31/12 for dysmenorrea. Had a negative wet prep, negative pregnancy test, GC/ Chlamydia cultures negative, UA positive bacteria. Did not take Doxycycline prescribed by ER, completed Flagyl and Macrodantin. Reports normal pap 2013.   No Gardasil.  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  Exam: appears well. Speculum exam: adherent circular white coating surrounding os. Pap.No CMT or adnexal fullness or tenderness.  Irregular cycles with menorrhagia and dysmenorrhea  Inflammation of cervix Contraception management  Plan: TSH, prolactin. Gardasil information reviewed and first given. Reviewed series of 3 and importance of returning to the office in 2 and 6 months to complete series. Pap pending. 3 months samples of Lo Loestrin given. Proper use and reviewed slight risk of blood clots and stroke. Encouraged condom use. Follow up in 3 months to reexamine cervix, evaluate cycle control and give second gardasil.

## 2012-08-07 LAB — TSH: TSH: 0.967 u[IU]/mL (ref 0.350–4.500)

## 2012-08-07 LAB — PROLACTIN: Prolactin: 11.4 ng/mL

## 2012-08-09 NOTE — Addendum Note (Signed)
Addended by: Richardson Chiquito on: 08/09/2012 10:55 AM   Modules accepted: Orders

## 2012-08-20 ENCOUNTER — Telehealth: Payer: Self-pay | Admitting: *Deleted

## 2012-08-20 NOTE — Telephone Encounter (Signed)
Pt called stating she was having some irregular bleeding while taking birth control pills. I explained to pt not unnormal to bleeding when stating on new BCP. Pt will keep track and follow up if needed.

## 2012-10-18 ENCOUNTER — Ambulatory Visit (INDEPENDENT_AMBULATORY_CARE_PROVIDER_SITE_OTHER): Payer: Self-pay | Admitting: *Deleted

## 2012-10-18 DIAGNOSIS — Z23 Encounter for immunization: Secondary | ICD-10-CM

## 2012-11-05 ENCOUNTER — Ambulatory Visit: Payer: Self-pay | Admitting: Women's Health

## 2012-11-09 ENCOUNTER — Telehealth: Payer: Self-pay | Admitting: *Deleted

## 2012-11-09 MED ORDER — IBUPROFEN 800 MG PO TABS: 800.0000 mg | ORAL_TABLET | Freq: Three times a day (TID) | ORAL | Status: DC | PRN

## 2012-11-09 NOTE — Telephone Encounter (Signed)
Take Motrin 800 mg 3 times a day for the next 2-3 days. Take two  oral contraceptive pills daily for the next 3 days and finished current pack then use barrier contraception and then  See  Harriett Sine for followup

## 2012-11-09 NOTE — Telephone Encounter (Signed)
Pt did not have motrin 800 mg, so rx will be sent she will do as directed below and follow up as needed.

## 2012-11-09 NOTE — Telephone Encounter (Signed)
You are back up MD pt is currently taking samples of lo Loestrin (in 3rd pack now)  given on OV 08/06/12. Pt said on Saturday she noticed some cramping follow by heavy bleeding changing tampon every 2-3 hours Pt said she period was about 2 weeks ago. She has a follow up visit with nancy scheduled on 11/18/12. Pt would like recommendations. Please advise

## 2012-11-18 ENCOUNTER — Ambulatory Visit (INDEPENDENT_AMBULATORY_CARE_PROVIDER_SITE_OTHER): Payer: Self-pay | Admitting: Women's Health

## 2012-11-18 ENCOUNTER — Encounter: Payer: Self-pay | Admitting: Women's Health

## 2012-11-18 DIAGNOSIS — N760 Acute vaginitis: Secondary | ICD-10-CM

## 2012-11-18 DIAGNOSIS — B9689 Other specified bacterial agents as the cause of diseases classified elsewhere: Secondary | ICD-10-CM

## 2012-11-18 DIAGNOSIS — A499 Bacterial infection, unspecified: Secondary | ICD-10-CM

## 2012-11-18 DIAGNOSIS — N898 Other specified noninflammatory disorders of vagina: Secondary | ICD-10-CM

## 2012-11-18 LAB — WET PREP FOR TRICH, YEAST, CLUE

## 2012-11-18 MED ORDER — METRONIDAZOLE 500 MG PO TABS: 500.0000 mg | ORAL_TABLET | Freq: Two times a day (BID) | ORAL | Status: DC

## 2012-11-18 NOTE — Progress Notes (Signed)
Patient ID: Connie Guzman, female   DOB: 1990-12-09, 22 y.o.   MRN: [id number] Presents for followup. Was noted to have a white coating on cervix at annual exam. Presents today with mild discharge, doing well on the lo Loestrin. Having some low abdominal discomfort midcycle. Same partner, negative cultures. Pap low-grade change, repeat Pap one year.  Exam: Appears well, external genitalia within normal limits, speculum exam cervix with ectopy with circular white adherent tissue, wet prep positive for amines, clues, TNTC bacteria. Cervix not friable, nontender, bimanual no CMT or adnexal fullness or tenderness with exam.  Bacteria vaginosis White tissue on cervix  Plan: Flagyl 500 twice daily for 5 days, prescription, proper use, alcohol for cautions reviewed. Instructed to call if continued abdominal pain for ultrasound. Continue lo Loestrin.

## 2013-02-03 ENCOUNTER — Other Ambulatory Visit: Payer: Self-pay

## 2013-05-03 ENCOUNTER — Encounter: Payer: Self-pay | Admitting: Women's Health

## 2013-05-03 ENCOUNTER — Ambulatory Visit (INDEPENDENT_AMBULATORY_CARE_PROVIDER_SITE_OTHER): Payer: BC Managed Care – PPO | Admitting: Women's Health

## 2013-05-03 DIAGNOSIS — Z23 Encounter for immunization: Secondary | ICD-10-CM

## 2013-05-03 DIAGNOSIS — N760 Acute vaginitis: Secondary | ICD-10-CM

## 2013-05-03 DIAGNOSIS — B9689 Other specified bacterial agents as the cause of diseases classified elsewhere: Secondary | ICD-10-CM

## 2013-05-03 DIAGNOSIS — A630 Anogenital (venereal) warts: Secondary | ICD-10-CM

## 2013-05-03 DIAGNOSIS — R102 Pelvic and perineal pain: Secondary | ICD-10-CM

## 2013-05-03 DIAGNOSIS — B977 Papillomavirus as the cause of diseases classified elsewhere: Secondary | ICD-10-CM

## 2013-05-03 DIAGNOSIS — A499 Bacterial infection, unspecified: Secondary | ICD-10-CM

## 2013-05-03 LAB — WET PREP FOR TRICH, YEAST, CLUE
Trich, Wet Prep: NONE SEEN
Yeast Wet Prep HPF POC: NONE SEEN

## 2013-05-03 MED ORDER — IMIQUIMOD 5 % EX CREA: TOPICAL_CREAM | CUTANEOUS | Status: DC

## 2013-05-03 MED ORDER — METRONIDAZOLE 0.75 % VA GEL: VAGINAL | Status: DC

## 2013-05-03 NOTE — Patient Instructions (Signed)
Bacterial Vaginosis Bacterial vaginosis is an infection of the vagina. It happens when too many of certain germs (bacteria) grow in the vagina. HOME CARE  Take your medicine as told by your doctor.  Finish your medicine even if you start to feel better.  Do not have sex until you finish your medicine and are better.  Tell your sex partner that you have an infection. They should see their doctor for treatment.  Practice safe sex. Use condoms. Have only one sex partner. GET HELP IF:  You are not getting better after 3 days of treatment.  You have more grey fluid (discharge) coming from your vagina than before.  You have more pain than before.  You have a fever. MAKE SURE YOU:   Understand these instructions.  Will watch your condition.  Will get help right away if you are not doing well or get worse. Document Released: 12/25/2007 Document Revised: 01/05/2013 Document Reviewed: 10/27/2012 Exit[employer] Patient Information 2014 [employer].  

## 2013-05-03 NOTE — Progress Notes (Signed)
Patient ID: Connie Guzman, female   DOB: 1991-02-25, 23 y.o.   MRN: [id number] Presents with complaint of vaginal discharge with odor, cycles irregular but mostly monthly/condoms. Same partner x4 years. Right lower intermittent abdominal pain for past several months. Does not like birth control pills, does not want to try other options. Denies urinary symptoms or fever. Has had first 2 gardasil.  Exam: Appears well. 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 Speculum exam moderate amount of a milky discharge with odor noted. Wet prep positive for amines, clues, TNTC bacteria. Bimanual no CMT or adnexal fullness or tenderness with exam.  Bacteria vaginosis Right lower intermittent abdominal pain Condyloma  Plan: MetroGel vaginal cream 1 applicator at bedtime x5, alcohol precautions were reviewed. Instructed to call if no relief of discharge. Third/final gardasil given. Aldara 5% apply at bedtime wash off in a.m. 3 times per week. Instructed to call if no relief. Schedule ultrasound to rule out GYN issue. Contraception options reviewed and declined will continue condoms. Instructed to call if cycles space greater than 8 weeks.

## 2013-05-11 ENCOUNTER — Ambulatory Visit (INDEPENDENT_AMBULATORY_CARE_PROVIDER_SITE_OTHER): Payer: BC Managed Care – PPO | Admitting: Women's Health

## 2013-05-11 ENCOUNTER — Other Ambulatory Visit: Payer: Self-pay | Admitting: Women's Health

## 2013-05-11 ENCOUNTER — Ambulatory Visit (INDEPENDENT_AMBULATORY_CARE_PROVIDER_SITE_OTHER): Payer: BC Managed Care – PPO

## 2013-05-11 DIAGNOSIS — R140 Abdominal distension (gaseous): Secondary | ICD-10-CM

## 2013-05-11 DIAGNOSIS — N8300 Follicular cyst of ovary, unspecified side: Secondary | ICD-10-CM

## 2013-05-11 DIAGNOSIS — N949 Unspecified condition associated with female genital organs and menstrual cycle: Secondary | ICD-10-CM

## 2013-05-11 DIAGNOSIS — R188 Other ascites: Secondary | ICD-10-CM

## 2013-05-11 DIAGNOSIS — R102 Pelvic and perineal pain: Secondary | ICD-10-CM

## 2013-05-11 DIAGNOSIS — R142 Eructation: Secondary | ICD-10-CM

## 2013-05-11 DIAGNOSIS — B9689 Other specified bacterial agents as the cause of diseases classified elsewhere: Secondary | ICD-10-CM

## 2013-05-11 DIAGNOSIS — A499 Bacterial infection, unspecified: Secondary | ICD-10-CM

## 2013-05-11 DIAGNOSIS — R143 Flatulence: Secondary | ICD-10-CM

## 2013-05-11 DIAGNOSIS — R141 Gas pain: Secondary | ICD-10-CM

## 2013-05-11 DIAGNOSIS — N760 Acute vaginitis: Secondary | ICD-10-CM

## 2013-05-11 MED ORDER — METRONIDAZOLE 500 MG PO TABS: 500.0000 mg | ORAL_TABLET | Freq: Two times a day (BID) | ORAL | Status: DC

## 2013-05-11 NOTE — Progress Notes (Signed)
Patient ID: Connie Guzman, female   DOB: 1990/07/08, 23 y.o.   MRN: [id number] Presents for ultrasound. Was treated for BV last week and was having right lower quadrant pain for several months, states pain is less today. Monthly cycle /condoms. Denies urinary symptoms. States did not use MetroGel due to cost.  Ultrasound: Anteverted uterus with homogenous tri layered endometrium. Right ovary and left ovary normal with follicles. Free fluid in cul-de-sac adjacent to right ovary 19 x 8 x 10 mm avascular. No apparent mass in right or left adnexa.   Resolving right lower quadrant pain Bacteria vaginosis  Plan: Flagyl 500 twice daily for 7 days, alcohol precautions were reviewed. Instructed to call if no relief of discharge or if right lower quadrant pain increases or changes. Instructed to call if prescription is to costly in the future.

## 2013-06-06 ENCOUNTER — Encounter (HOSPITAL_COMMUNITY): Payer: Self-pay | Admitting: *Deleted

## 2013-06-06 ENCOUNTER — Inpatient Hospital Stay (HOSPITAL_COMMUNITY)
Admission: AD | Admit: 2013-06-06 | Discharge: 2013-06-06 | Disposition: A | Discharge status: Home / Self Care | Payer: BC Managed Care – PPO | Source: Ambulatory Visit | Attending: Obstetrics & Gynecology | Admitting: Obstetrics & Gynecology

## 2013-06-06 DIAGNOSIS — N946 Dysmenorrhea, unspecified: Secondary | ICD-10-CM

## 2013-06-06 DIAGNOSIS — R109 Unspecified abdominal pain: Secondary | ICD-10-CM | POA: Insufficient documentation

## 2013-06-06 DIAGNOSIS — R112 Nausea with vomiting, unspecified: Secondary | ICD-10-CM

## 2013-06-06 LAB — URINE MICROSCOPIC-ADD ON

## 2013-06-06 LAB — POCT PREGNANCY, URINE: Preg Test, Ur: NEGATIVE

## 2013-06-06 LAB — URINALYSIS, ROUTINE W REFLEX MICROSCOPIC
BILIRUBIN URINE: NEGATIVE
Glucose, UA: NEGATIVE mg/dL
Ketones, ur: NEGATIVE mg/dL
LEUKOCYTES UA: NEGATIVE
NITRITE: NEGATIVE
Protein, ur: 30 mg/dL — AB
Specific Gravity, Urine: 1.025 (ref 1.005–1.030)
UROBILINOGEN UA: 0.2 mg/dL (ref 0.0–1.0)
pH: 6.5 (ref 5.0–8.0)

## 2013-06-06 LAB — WET PREP, GENITAL
Clue Cells Wet Prep HPF POC: NONE SEEN
Trich, Wet Prep: NONE SEEN
WBC, Wet Prep HPF POC: NONE SEEN
Yeast Wet Prep HPF POC: NONE SEEN

## 2013-06-06 MED ORDER — IBUPROFEN 600 MG PO TABS: 600.0000 mg | ORAL_TABLET | Freq: Four times a day (QID) | ORAL | Status: DC | PRN

## 2013-06-06 MED ORDER — ONDANSETRON 4 MG PO TBDP
4.0000 mg | ORAL_TABLET | Freq: Once | ORAL | Status: AC
  Administered 2013-06-06: 4 mg via ORAL
  Filled 2013-06-06: qty 1

## 2013-06-06 MED ORDER — ONDANSETRON 4 MG PO TBDP: 4.0000 mg | ORAL_TABLET | Freq: Once | ORAL | Status: DC

## 2013-06-06 MED ORDER — KETOROLAC TROMETHAMINE 60 MG/2ML IM SOLN
60.0000 mg | Freq: Once | INTRAMUSCULAR | Status: AC
  Administered 2013-06-06: 60 mg via INTRAMUSCULAR
  Filled 2013-06-06: qty 2

## 2013-06-06 NOTE — MAU Note (Signed)
Very lightheaded & dizzy this a.m., vomited, severe abd cramping for the last 1 1/2 weeks, started bleeding this a.m.

## 2013-06-06 NOTE — MAU Provider Note (Signed)
History     CSN: [id number]  Arrival date and time: 06/06/13 1103   None     Chief Complaint  Patient presents with  . Abdominal Pain  . Dizziness   HPI  Connie Guzman is a 23yo Female who presents to MAU with chief complaint of abdominal pain. She experienced cramping/ abdominal pain everyday last week, and states that the pain worsened this morning. She describes the pain as sharp, cramping, 9/10 in severity, located in her lower abdomen and radiating to her low back. She also experienced nausea and vomiting this morning, vomiting 3 times. She felt lightheaded and dizzy but states that this has resolved since arrival. She notes vaginal bleeding starting this morning, and that she expected her period to start today. She reports a similar episode last year in which she passed out leading up to her period. Symptoms like the ones she is experiencing are typical for her when she starts her menstrual cycle.   Pt denies dizziness at this time; has resolved.      Past Medical History  Diagnosis Date  . Heart murmur   . Chlamydia   . Inflammation of cervix     Past Surgical History  Procedure Laterality Date  . Wisdom tooth extraction  2012  . Tonsillectomy and adenoidectomy  2009  . Tubes in ears      Family History  Problem Relation Age of Onset  . Breast cancer Mother   . Breast cancer Maternal Grandmother   . Diabetes Maternal Grandmother   . Hyperlipidemia Maternal Grandmother   . Hypertension Maternal Grandmother   . Other Maternal Grandmother     fibroid uterus    History  Substance Use Topics  . Smoking status: Never Smoker   . Smokeless tobacco: Never Used  . Alcohol Use: 0.6 oz/week    1 Glasses of wine per week     Comment: last 07/17/2012    Allergies:  Allergies  Allergen Reactions  . Chocolate Other (See Comments)    Throat swells and gets scratchy.  Face breaks out in pimples    Prescriptions prior to admission  Medication Sig Dispense Refill   . acetaminophen (TYLENOL) 500 MG tablet Take 500 mg by mouth every 6 (six) hours as needed for moderate pain.      Marland Kitchen. ibuprofen (ADVIL,MOTRIN) 800 MG tablet Take 1 tablet (800 mg total) by mouth every 8 (eight) hours as needed for pain.  30 tablet  0  . Multiple Vitamin (MULTIVITAMIN) tablet Take 1 tablet by mouth daily.       Results for orders placed during the hospital encounter of 06/06/13 (from the past 48 hour(s))  URINALYSIS, ROUTINE W REFLEX MICROSCOPIC     Status: Abnormal   Collection Time    06/06/13 11:25 AM      Result Value Ref Range   Color, Urine YELLOW  YELLOW   APPearance CLEAR  CLEAR   Specific Gravity, Urine 1.025  1.005 - 1.030   pH 6.5  5.0 - 8.0   Glucose, UA NEGATIVE  NEGATIVE mg/dL   Hgb urine dipstick LARGE (*) NEGATIVE   Bilirubin Urine NEGATIVE  NEGATIVE   Ketones, ur NEGATIVE  NEGATIVE mg/dL   Protein, ur 30 (*) NEGATIVE mg/dL   Urobilinogen, UA 0.2  0.0 - 1.0 mg/dL   Nitrite NEGATIVE  NEGATIVE   Leukocytes, UA NEGATIVE  NEGATIVE  URINE MICROSCOPIC-ADD ON     Status: None   Collection Time    06/06/13 11:25 AM  Result Value Ref Range   Squamous Epithelial / LPF RARE  RARE   RBC / HPF 11-20  <3 RBC/hpf   Bacteria, UA RARE  RARE  POCT PREGNANCY, URINE     Status: None   Collection Time    06/06/13 11:39 AM      Result Value Ref Range   Preg Test, Ur NEGATIVE  NEGATIVE   Comment:            THE SENSITIVITY OF THIS     METHODOLOGY IS >24 mIU/mL  WET PREP, GENITAL     Status: None   Collection Time    06/06/13  1:30 PM      Result Value Ref Range   Yeast Wet Prep HPF POC NONE SEEN  NONE SEEN   Trich, Wet Prep NONE SEEN  NONE SEEN   Clue Cells Wet Prep HPF POC NONE SEEN  NONE SEEN   WBC, Wet Prep HPF POC NONE SEEN  NONE SEEN   Comment: NO BACTERIA SEEN    Review of Systems  Constitutional: Positive for malaise/fatigue. Negative for fever, chills and diaphoresis.  Genitourinary: Negative for dysuria, urgency, frequency, hematuria and flank  pain.   Physical Exam   Blood pressure 104/74, pulse 77, temperature 98.3 F (36.8 C), temperature source Oral, resp. rate 18, height 5\' 7"  (1.702 Guzman), weight 52.345 kg (115 lb 6.4 oz), last menstrual period 06/06/2013.  Physical Exam  Constitutional: She appears well-developed and well-nourished. No distress.  GI: There is no CVA tenderness.  Genitourinary: Vagina normal and uterus normal. There is no rash, tenderness, lesion or injury on the right labia. There is no rash, tenderness, lesion or injury on the left labia. No erythema or tenderness around the vagina. No foreign body around the vagina. No signs of injury around the vagina.  Speculum exam: Vagina - Moderate amount of dark red blood pooling in vagina, no odor Cervix - small active bleeding from cervical os Bimanual exam: Cervix closed Uterus non tender, normal size Adnexa non tender, no masses bilaterally GC/Chlam, wet prep done Chaperone present for exam.   Skin: She is not diaphoretic.    MAU Course  Procedures None  MDM UA  UPT Toradol Zofran 4 mg as requested by the patient    Assessment and Plan   A: Dysmenorrhea      Nausea and vomiting with menstrual cycle   P; Discharge home in stable condition      RX: ibuprofen 600 mg tabs Q6 hours as needed for pain             Zofran # 10      Bleeding precautions discussed       Return to MAU as needed, if symptoms worsen   Connie Guzman 06/06/2013, 1:11 PM   Evaluation and management procedures were performed by the PA student under my supervision and collaboration. I have reviewed the note and chart, and I agree with the management and plan.  Connie Hansen Rasch, NP 06/06/2013 2:38 PM

## 2013-06-06 NOTE — Discharge Instructions (Signed)

## 2013-06-07 LAB — GC/CHLAMYDIA PROBE AMP
CT Probe RNA: NEGATIVE
GC PROBE AMP APTIMA: NEGATIVE

## 2013-07-10 ENCOUNTER — Encounter (HOSPITAL_COMMUNITY): Payer: Self-pay

## 2013-07-10 ENCOUNTER — Inpatient Hospital Stay (HOSPITAL_COMMUNITY)
Admission: AD | Admit: 2013-07-10 | Discharge: 2013-07-10 | Disposition: A | Discharge status: Home / Self Care | Payer: BC Managed Care – PPO | Source: Ambulatory Visit | Attending: Gynecology | Admitting: Gynecology

## 2013-07-10 DIAGNOSIS — N946 Dysmenorrhea, unspecified: Secondary | ICD-10-CM | POA: Insufficient documentation

## 2013-07-10 DIAGNOSIS — N949 Unspecified condition associated with female genital organs and menstrual cycle: Secondary | ICD-10-CM | POA: Insufficient documentation

## 2013-07-10 DIAGNOSIS — R109 Unspecified abdominal pain: Secondary | ICD-10-CM | POA: Insufficient documentation

## 2013-07-10 DIAGNOSIS — N390 Urinary tract infection, site not specified: Secondary | ICD-10-CM

## 2013-07-10 DIAGNOSIS — R011 Cardiac murmur, unspecified: Secondary | ICD-10-CM | POA: Insufficient documentation

## 2013-07-10 LAB — URINALYSIS, ROUTINE W REFLEX MICROSCOPIC
BILIRUBIN URINE: NEGATIVE
GLUCOSE, UA: NEGATIVE mg/dL
Ketones, ur: NEGATIVE mg/dL
Leukocytes, UA: NEGATIVE
Nitrite: POSITIVE — AB
Specific Gravity, Urine: 1.02 (ref 1.005–1.030)
UROBILINOGEN UA: 1 mg/dL (ref 0.0–1.0)
pH: 8 (ref 5.0–8.0)

## 2013-07-10 LAB — WET PREP, GENITAL
TRICH WET PREP: NONE SEEN
YEAST WET PREP: NONE SEEN

## 2013-07-10 LAB — URINE MICROSCOPIC-ADD ON

## 2013-07-10 MED ORDER — SULFAMETHOXAZOLE-TMP DS 800-160 MG PO TABS: 1.0000 | ORAL_TABLET | Freq: Two times a day (BID) | ORAL | Status: DC

## 2013-07-10 MED ORDER — KETOROLAC TROMETHAMINE 60 MG/2ML IM SOLN
60.0000 mg | Freq: Once | INTRAMUSCULAR | Status: AC
  Administered 2013-07-10: 60 mg via INTRAMUSCULAR
  Filled 2013-07-10: qty 2

## 2013-07-10 MED ORDER — IBUPROFEN 800 MG PO TABS: 800.0000 mg | ORAL_TABLET | Freq: Three times a day (TID) | ORAL | Status: DC | PRN

## 2013-07-10 NOTE — Discharge Instructions (Signed)

## 2013-07-10 NOTE — MAU Provider Note (Signed)
History     CSN: [id number]  Arrival date and time: 07/10/13 1454   First Provider Initiated Contact with Patient 07/10/13 1507      Chief Complaint  Patient presents with  . Abdominal Pain   HPI 23 y.o. G0P0 with pelvic pain starting today, period starting today as well. Did not take any pain medication at home, came to MAU by ambulance, had Fentanyl 100 mcg in ambulance with some relief, but still rating pain at 9/10. Pt has long history of dysmenorrhea with multiple ED visits for same c/o. Was started on OCPs last year for this problem, but is not taking them any longer as she states they weren't helping. States she and her GYN provider made a plan to start Depo Provera, but hasn't started it yet.   Past Medical History  Diagnosis Date  . Heart murmur   . Chlamydia   . Inflammation of cervix     Past Surgical History  Procedure Laterality Date  . Wisdom tooth extraction  2012  . Tonsillectomy and adenoidectomy  2009  . Tubes in ears      Family History  Problem Relation Age of Onset  . Breast cancer Mother   . Breast cancer Maternal Grandmother   . Diabetes Maternal Grandmother   . Hyperlipidemia Maternal Grandmother   . Hypertension Maternal Grandmother   . Other Maternal Grandmother     fibroid uterus    History  Substance Use Topics  . Smoking status: Never Smoker   . Smokeless tobacco: Never Used  . Alcohol Use: 0.6 oz/week    1 Glasses of wine per week     Comment: last 07/17/2012    Allergies:  Allergies  Allergen Reactions  . Chocolate Other (See Comments)    Throat swells and gets scratchy.  Face breaks out in pimples    Prescriptions prior to admission  Medication Sig Dispense Refill  . Nutritional Supplements (CARNATION BREAKFAST ESSENTIALS) LIQD Take 0.5 each by mouth 2 (two) times daily.      . [DISCONTINUED] Diclofenac Sodium (VOLTAREN PO) Take 1 tablet by mouth 2 (two) times daily as needed (For shoulder pain.).      . [DISCONTINUED]  ibuprofen (ADVIL,MOTRIN) 600 MG tablet Take 1 tablet (600 mg total) by mouth every 6 (six) hours as needed.  30 tablet  0    Review of Systems  Constitutional: Negative.   Respiratory: Negative.   Cardiovascular: Negative.   Gastrointestinal: Positive for nausea and abdominal pain. Negative for vomiting, diarrhea and constipation.  Genitourinary: Negative for dysuria, urgency, frequency, hematuria and flank pain.       + vaginal bleeding   Musculoskeletal: Negative.   Neurological: Negative.   Psychiatric/Behavioral: Negative.    Physical Exam   Blood pressure 111/76, pulse 72, temperature 97.8 F (36.6 C), temperature source Oral, resp. rate 18, last menstrual period 07/10/2013.  Physical Exam  Nursing note and vitals reviewed. Constitutional: She is oriented to person, place, and time. She appears well-developed and well-nourished. No distress.  HENT:  Head: Normocephalic and atraumatic.  Cardiovascular: Normal rate.   Respiratory: Effort normal. No respiratory distress.  GI: Soft. Bowel sounds are normal. She exhibits no distension and no mass. There is no tenderness. There is no rebound and no guarding.  Genitourinary: There is no rash or lesion on the right labia. There is no rash or lesion on the left labia. Uterus is tender. Uterus is not deviated, not enlarged and not fixed. Cervix exhibits no motion tenderness,  no discharge and no friability. Right adnexum displays tenderness. Right adnexum displays no mass and no fullness. Left adnexum displays tenderness. Left adnexum displays no mass and no fullness. There is bleeding (moderate) around the vagina. No erythema or tenderness around the vagina. No vaginal discharge found.  Neurological: She is alert and oriented to person, place, and time.  Skin: Skin is warm and dry.  Psychiatric: She has a normal mood and affect.    MAU Course  Procedures  Results for orders placed during the hospital encounter of 07/10/13 (from the  past 24 hour(s))  URINALYSIS, ROUTINE W REFLEX MICROSCOPIC     Status: Abnormal   Collection Time    07/10/13  3:20 PM      Result Value Ref Range   Color, Urine RED (*) YELLOW   APPearance CLOUDY (*) CLEAR   Specific Gravity, Urine 1.020  1.005 - 1.030   pH 8.0  5.0 - 8.0   Glucose, UA NEGATIVE  NEGATIVE mg/dL   Hgb urine dipstick LARGE (*) NEGATIVE   Bilirubin Urine NEGATIVE  NEGATIVE   Ketones, ur NEGATIVE  NEGATIVE mg/dL   Protein, ur >454 (*) NEGATIVE mg/dL   Urobilinogen, UA 1.0  0.0 - 1.0 mg/dL   Nitrite POSITIVE (*) NEGATIVE   Leukocytes, UA NEGATIVE  NEGATIVE  URINE MICROSCOPIC-ADD ON     Status: Abnormal   Collection Time    07/10/13  3:20 PM      Result Value Ref Range   RBC / HPF TOO NUMEROUS TO COUNT  <3 RBC/hpf   Bacteria, UA MANY (*) RARE   Urine-Other MUCOUS PRESENT    WET PREP, GENITAL     Status: Abnormal   Collection Time    07/10/13  4:00 PM      Result Value Ref Range   Yeast Wet Prep HPF POC NONE SEEN  NONE SEEN   Trich, Wet Prep NONE SEEN  NONE SEEN   Clue Cells Wet Prep HPF POC RARE (*) NONE SEEN   WBC, Wet Prep HPF POC FEW (*) NONE SEEN    Some relief with Toradol 60 mg IM Assessment and Plan   1. Dysmenorrhea   2. UTI (urinary tract infection)   Rx Motrin 800 mg TID for pain, Bactrim DS bid x 3 days for UTI Call office tomorrow for outpatient follow up    Medication List    STOP taking these medications       VOLTAREN PO      TAKE these medications       CARNATION BREAKFAST ESSENTIALS Liqd  Take 0.5 each by mouth 2 (two) times daily.     ibuprofen 800 MG tablet  Commonly known as:  ADVIL,MOTRIN  Take 1 tablet (800 mg total) by mouth every 8 (eight) hours as needed for moderate pain or cramping.     sulfamethoxazole-trimethoprim 800-160 MG per tablet  Commonly known as:  BACTRIM DS  Take 1 tablet by mouth 2 (two) times daily.            Follow-up Information   Schedule an appointment as soon as possible for a visit with  Harrington Challenger, NP.   Specialty:  Obstetrics and Gynecology   Contact information:   [address] [place] [address] [phone]         Archie Patten 07/10/2013, 4:42 PM

## 2013-07-10 NOTE — MAU Note (Signed)
Pt presents via EMS with severe lower abdominal cramping that started this afternoon with the start of her menstrual cycle. Complains of nausea and vomiting from the pain

## 2013-07-11 LAB — GC/CHLAMYDIA PROBE AMP
CT PROBE, AMP APTIMA: NEGATIVE
GC Probe RNA: NEGATIVE

## 2013-07-11 LAB — POCT PREGNANCY, URINE: Preg Test, Ur: NEGATIVE

## 2013-10-11 DIAGNOSIS — M545 Low back pain, unspecified: Secondary | ICD-10-CM | POA: Insufficient documentation

## 2013-10-11 DIAGNOSIS — G8929 Other chronic pain: Secondary | ICD-10-CM | POA: Insufficient documentation

## 2014-04-16 ENCOUNTER — Encounter (HOSPITAL_COMMUNITY): Payer: Self-pay | Admitting: *Deleted

## 2014-04-16 ENCOUNTER — Inpatient Hospital Stay (HOSPITAL_COMMUNITY)
Admission: AD | Admit: 2014-04-16 | Discharge: 2014-04-17 | Disposition: A | Discharge status: Home / Self Care | Payer: BLUE CROSS/BLUE SHIELD | Source: Ambulatory Visit | Attending: Obstetrics & Gynecology | Admitting: Obstetrics & Gynecology

## 2014-04-16 DIAGNOSIS — G44209 Tension-type headache, unspecified, not intractable: Secondary | ICD-10-CM | POA: Diagnosis not present

## 2014-04-16 DIAGNOSIS — H6692 Otitis media, unspecified, left ear: Secondary | ICD-10-CM | POA: Diagnosis not present

## 2014-04-16 DIAGNOSIS — R51 Headache: Secondary | ICD-10-CM | POA: Diagnosis present

## 2014-04-16 DIAGNOSIS — G44219 Episodic tension-type headache, not intractable: Secondary | ICD-10-CM

## 2014-04-16 MED ORDER — IBUPROFEN 800 MG PO TABS
800.0000 mg | ORAL_TABLET | Freq: Once | ORAL | Status: AC
  Administered 2014-04-16: 800 mg via ORAL
  Filled 2014-04-16: qty 1

## 2014-04-16 MED ORDER — PSEUDOEPHEDRINE HCL 30 MG PO TABS
30.0000 mg | ORAL_TABLET | Freq: Once | ORAL | Status: AC
  Administered 2014-04-16: 30 mg via ORAL
  Filled 2014-04-16: qty 1

## 2014-04-16 MED ORDER — BUTALBITAL-APAP-CAFFEINE 50-325-40 MG PO TABS
2.0000 | ORAL_TABLET | Freq: Once | ORAL | Status: AC
  Administered 2014-04-16: 2 via ORAL
  Filled 2014-04-16: qty 2

## 2014-04-16 NOTE — MAU Note (Signed)
Pt reports a very bad headache since yesterday, swelling in right side of face, rt earache, and she feels really tired and weak. Denies fever.

## 2014-04-16 NOTE — MAU Provider Note (Signed)
History     CSN: [id number]  Arrival date and time: 04/16/14 2205   First Provider Initiated Contact with Patient 04/16/14 2243      Chief Complaint  Patient presents with  . Otalgia  . Headache   HPI Connie Guzman is a 24 y.o. G0P0 who presents today with a headache, and an ear ache. She states that yesterday she developed a headache. She gets migraines frequently, and took gabapentin for her headache yesterday. It did not help, and she has not tried anything today. She has not tired anything for the earache at this time. She states that she is prone to sinus infections as well.    She is on depo-provera for contraception, and has her last injection one week ago.   Past Medical History  Diagnosis Date  . Heart murmur   . Chlamydia   . Inflammation of cervix     Past Surgical History  Procedure Laterality Date  . Wisdom tooth extraction  2012  . Tonsillectomy and adenoidectomy  2009  . Tubes in ears      Family History  Problem Relation Age of Onset  . Breast cancer Mother   . Breast cancer Maternal Grandmother   . Diabetes Maternal Grandmother   . Hyperlipidemia Maternal Grandmother   . Hypertension Maternal Grandmother   . Other Maternal Grandmother     fibroid uterus    History  Substance Use Topics  . Smoking status: Never Smoker   . Smokeless tobacco: Never Used  . Alcohol Use: 0.6 oz/week    1 Glasses of wine per week     Comment: last 07/17/2012    Allergies:  Allergies  Allergen Reactions  . Chocolate Other (See Comments)    Throat swells and gets scratchy.  Face breaks out in pimples    Prescriptions prior to admission  Medication Sig Dispense Refill Last Dose  . ibuprofen (ADVIL,MOTRIN) 800 MG tablet Take 1 tablet (800 mg total) by mouth every 8 (eight) hours as needed for moderate pain or cramping. 30 tablet 0   . Nutritional Supplements (CARNATION BREAKFAST ESSENTIALS) LIQD Take 0.5 each by mouth 2 (two) times daily.   07/09/2013 at Unknown  time  . sulfamethoxazole-trimethoprim (BACTRIM DS) 800-160 MG per tablet Take 1 tablet by mouth 2 (two) times daily. 6 tablet 0     ROS Physical Exam   Blood pressure 95/60, pulse 96, temperature 98.5 F (36.9 C), temperature source Oral, resp. rate 16, height 5\' 7"  (1.702 m), weight 53.071 kg (117 lb), SpO2 100 %.  Physical Exam  Nursing note and vitals reviewed. Constitutional: She is oriented to person, place, and time. She appears well-developed and well-nourished. No distress.  HENT:  Right Ear: External ear and ear canal normal. No tenderness. Tympanic membrane is not injected, not perforated, not erythematous, not retracted and not bulging. A middle ear effusion is present.  Left Ear: Ear canal normal. There is tenderness. Tympanic membrane is injected, erythematous and bulging. Tympanic membrane is not perforated and not retracted. A middle ear effusion is present.  Cardiovascular: Normal rate.   Respiratory: Effort normal. No respiratory distress.  GI: Soft. There is no tenderness.  Neurological: She is alert and oriented to person, place, and time.  Skin: Skin is warm and dry.  Psychiatric: She has a normal mood and affect.    MAU Course  Procedures  Given: fioricet, ibuprofen and sudafed here in MAU  0002: Patient reports that her headache and ear pain have improved  Assessment and Plan   1. Acute left otitis media, recurrence not specified, unspecified otitis media type   2. Episodic tension-type headache, not intractable    DC home Sudafed and ibuprofen PRN Return to MAU as needed   Follow-up Information    Follow up with [facility] [place].   Why:  If symptoms worsen   Contact information:   [address] [facility] [place] [address] [phone]        Medication List    STOP taking these medications        sulfamethoxazole-trimethoprim 800-160 MG per tablet  Commonly known as:  BACTRIM DS      TAKE these medications         amoxicillin-clavulanate 875-125 MG per tablet  Commonly known as:  AUGMENTIN  Take 1 tablet by mouth every 12 (twelve) hours.     CARNATION BREAKFAST ESSENTIALS Liqd  Take 0.5 each by mouth 2 (two) times daily.     ibuprofen 800 MG tablet  Commonly known as:  ADVIL,MOTRIN  Take 1 tablet (800 mg total) by mouth every 8 (eight) hours as needed for moderate pain or cramping.         Tawnya Crook 04/16/2014, 10:48 PM

## 2014-04-17 DIAGNOSIS — H6692 Otitis media, unspecified, left ear: Secondary | ICD-10-CM

## 2014-04-17 MED ORDER — AMOXICILLIN-POT CLAVULANATE 875-125 MG PO TABS: 1.0000 | ORAL_TABLET | Freq: Two times a day (BID) | ORAL | Status: DC

## 2014-04-17 NOTE — Discharge Instructions (Signed)
Sudafed as needed for ear pain and stuffiness  Motrin 800 mg every 8 hours for ear pain    Otitis Media Otitis media is redness, soreness, and inflammation of the middle ear. Otitis media may be caused by allergies or, most commonly, by infection. Often it occurs as a complication of the common cold. SIGNS AND SYMPTOMS Symptoms of otitis media may include:  Earache.  Fever.  Ringing in your ear.  Headache.  Leakage of fluid from the ear. DIAGNOSIS To diagnose otitis media, your health care provider will examine your ear with an otoscope. This is an instrument that allows your health care provider to see into your ear in order to examine your eardrum. Your health care provider also will ask you questions about your symptoms. TREATMENT  Typically, otitis media resolves on its own within 3-5 days. Your health care provider may prescribe medicine to ease your symptoms of pain. If otitis media does not resolve within 5 days or is recurrent, your health care provider may prescribe antibiotic medicines if he or she suspects that a bacterial infection is the cause. HOME CARE INSTRUCTIONS   If you were prescribed an antibiotic medicine, finish it all even if you start to feel better.  Take medicines only as directed by your health care provider.  Keep all follow-up visits as directed by your health care provider. SEEK MEDICAL CARE IF:  You have otitis media only in one ear, or bleeding from your nose, or both.  You notice a lump on your neck.  You are not getting better in 3-5 days.  You feel worse instead of better. SEEK IMMEDIATE MEDICAL CARE IF:   You have pain that is not controlled with medicine.  You have swelling, redness, or pain around your ear or stiffness in your neck.  You notice that part of your face is paralyzed.  You notice that the bone behind your ear (mastoid) is tender when you touch it. MAKE SURE YOU:   Understand these instructions.  Will watch your  condition.  Will get help right away if you are not doing well or get worse. Document Released: 12/21/2003 Document Revised: 08/01/2013 Document Reviewed: 10/12/2012 [facility] Patient Information 2015 [employer], [place]. This information is not intended to replace advice given to you by your health care provider. Make sure you discuss any questions you have with your health care provider.

## 2014-06-12 ENCOUNTER — Emergency Department (HOSPITAL_COMMUNITY)
Admission: EM | Admit: 2014-06-12 | Discharge: 2014-06-12 | Disposition: A | Discharge status: Home / Self Care | Payer: BLUE CROSS/BLUE SHIELD | Attending: Emergency Medicine | Admitting: Emergency Medicine

## 2014-06-12 ENCOUNTER — Encounter (HOSPITAL_COMMUNITY): Payer: Self-pay

## 2014-06-12 DIAGNOSIS — Z792 Long term (current) use of antibiotics: Secondary | ICD-10-CM | POA: Diagnosis not present

## 2014-06-12 DIAGNOSIS — Z79899 Other long term (current) drug therapy: Secondary | ICD-10-CM | POA: Insufficient documentation

## 2014-06-12 DIAGNOSIS — G43809 Other migraine, not intractable, without status migrainosus: Secondary | ICD-10-CM | POA: Diagnosis not present

## 2014-06-12 DIAGNOSIS — R011 Cardiac murmur, unspecified: Secondary | ICD-10-CM | POA: Insufficient documentation

## 2014-06-12 DIAGNOSIS — Z8619 Personal history of other infectious and parasitic diseases: Secondary | ICD-10-CM | POA: Insufficient documentation

## 2014-06-12 DIAGNOSIS — Z87448 Personal history of other diseases of urinary system: Secondary | ICD-10-CM | POA: Diagnosis not present

## 2014-06-12 DIAGNOSIS — H9201 Otalgia, right ear: Secondary | ICD-10-CM | POA: Diagnosis present

## 2014-06-12 MED ORDER — DIPHENHYDRAMINE HCL 50 MG/ML IJ SOLN
25.0000 mg | Freq: Once | INTRAMUSCULAR | Status: AC
  Administered 2014-06-12: 25 mg via INTRAVENOUS
  Filled 2014-06-12: qty 1

## 2014-06-12 MED ORDER — KETOROLAC TROMETHAMINE 30 MG/ML IJ SOLN
30.0000 mg | Freq: Once | INTRAMUSCULAR | Status: AC
  Administered 2014-06-12: 30 mg via INTRAVENOUS
  Filled 2014-06-12: qty 1

## 2014-06-12 MED ORDER — METOCLOPRAMIDE HCL 5 MG/ML IJ SOLN
10.0000 mg | Freq: Once | INTRAMUSCULAR | Status: AC
  Administered 2014-06-12: 10 mg via INTRAVENOUS
  Filled 2014-06-12: qty 2

## 2014-06-12 NOTE — ED Provider Notes (Signed)
CSN: [id number]     Arrival date & time 06/12/14  [id number] History   First MD Initiated Contact with Patient 06/12/14 [phone]     Chief Complaint  Patient presents with  . Otalgia  . Migraine     (Consider location/radiation/quality/duration/timing/severity/associated sxs/prior Treatment) HPI Comments: Patient presents today with a chief complaint of pain of the right ear and also a Migraine Headache.  She reports that the right ear pain has been constant for the past 2 months.  She was seen a MAU two months ago and diagnosed with a right OM at that time and given antibiotics, which she states that she completed, but did not help the pain.  She denies any ear drainage.  Denies any injury to the ear.  She reports that she had an oral temp of 99.6 F last evening, but no fever today.  She reports that she has also had a headache for the past 2 days.  Headache similar to migraine headaches that she has had in the past.  Headache gradual in onset.  She took Ibuprofen for her headache without relief.  She denies nausea, vomiting, or vision changes.  She does report associated photophobia.  Patient is a 24 y.o. female presenting with ear pain and migraines. The history is provided by the patient.  Otalgia Associated symptoms: headaches   Migraine Associated symptoms include headaches.    Past Medical History  Diagnosis Date  . Heart murmur   . Chlamydia   . Inflammation of cervix    Past Surgical History  Procedure Laterality Date  . Wisdom tooth extraction  2012  . Tonsillectomy and adenoidectomy  2009  . Tubes in ears     Family History  Problem Relation Age of Onset  . Breast cancer Mother   . Breast cancer Maternal Grandmother   . Diabetes Maternal Grandmother   . Hyperlipidemia Maternal Grandmother   . Hypertension Maternal Grandmother   . Other Maternal Grandmother     fibroid uterus   History  Substance Use Topics  . Smoking status: Never Smoker   . Smokeless tobacco: Never Used   . Alcohol Use: 0.6 oz/week    1 Glasses of wine per week     Comment: last drink Apr 11 2014   OB History    Gravida Para Term Preterm AB TAB SAB Ectopic Multiple Living   0              Review of Systems  HENT: Positive for ear pain.   Neurological: Positive for headaches.  All other systems reviewed and are negative.     Allergies  Chocolate  Home Medications   Prior to Admission medications   Medication Sig Start Date End Date Taking? Authorizing Provider  amoxicillin-clavulanate (AUGMENTIN) 875-125 MG per tablet Take 1 tablet by mouth every 12 (twelve) hours. 04/17/14   Armando ReichertHeather D Hogan, CNM  ibuprofen (ADVIL,MOTRIN) 800 MG tablet Take 1 tablet (800 mg total) by mouth every 8 (eight) hours as needed for moderate pain or cramping. 07/10/13   Archie PattenNatalie K Frazier, CNM  Nutritional Supplements (CARNATION BREAKFAST ESSENTIALS) LIQD Take 0.5 each by mouth 2 (two) times daily.    Historical Provider, MD   BP 116/71 mmHg  Pulse 97  Temp(Src) 97.8 F (36.6 C) (Oral)  Resp 16  SpO2 100% Physical Exam  Constitutional: She is oriented to person, place, and time. She appears well-developed and well-nourished.  HENT:  Head: Normocephalic and atraumatic.  Right Ear: Ear canal normal. No  mastoid tenderness. Tympanic membrane is scarred. Tympanic membrane is not perforated, not erythematous, not retracted and not bulging.  Left Ear: Ear canal normal. No mastoid tenderness. Tympanic membrane is scarred. Tympanic membrane is not perforated, not erythematous, not retracted and not bulging.  Nose: Nose normal.  Mouth/Throat: Uvula is midline, oropharynx is clear and moist and mucous membranes are normal.  Chronic scarring of the TM bilaterally from prior tubes in ears  Eyes: EOM are normal. Pupils are equal, round, and reactive to light.  Neck: Normal range of motion. Neck supple.  Cardiovascular: Normal rate, regular rhythm and normal heart sounds.   Pulmonary/Chest: Effort normal and  breath sounds normal.  Neurological: She is alert and oriented to person, place, and time. She has normal strength. No cranial nerve deficit or sensory deficit. Coordination and gait normal.  Nursing note and vitals reviewed.   ED Course  Procedures (including critical care time) Labs Review Labs Reviewed - No data to display  Imaging Review No results found.   EKG Interpretation None     10:55 AM Reassessed patient.  She reports that her headache has improved at this time.   MDM   Final diagnoses:  None   Pt with a history of Migraines presents today with headaches.  HA treated and improved while in ED.  Presentation is like pts typical HA and non concerning for [facility], ICH, Meningitis, or temporal arteritis. Pt is afebrile with no focal neuro deficits, nuchal rigidity, or change in vision. Pt is to follow up with PCP to discuss prophylactic medication. Pt verbalizes understanding and is agreeable with plan to dc.  Return precautions given.     Santiago Glad, PA-C 06/13/14 2232  Bethann Berkshire, MD 06/13/14 [phone]

## 2014-06-12 NOTE — ED Notes (Signed)
Per pt, earache since January.  Given antibiotics that "do not work".  Now this week pain shooting inside ear with facial swelling and headache.  Fever yesterday of "100"

## 2014-06-12 NOTE — ED Notes (Signed)
PA at bedside.

## 2014-12-10 ENCOUNTER — Encounter (HOSPITAL_COMMUNITY): Payer: Self-pay | Admitting: *Deleted

## 2014-12-10 ENCOUNTER — Inpatient Hospital Stay (HOSPITAL_COMMUNITY)
Admission: AD | Admit: 2014-12-10 | Discharge: 2014-12-10 | Disposition: A | Discharge status: Home / Self Care | Payer: BLUE CROSS/BLUE SHIELD | Source: Ambulatory Visit | Attending: Obstetrics & Gynecology | Admitting: Obstetrics & Gynecology

## 2014-12-10 DIAGNOSIS — N911 Secondary amenorrhea: Secondary | ICD-10-CM | POA: Diagnosis not present

## 2014-12-10 DIAGNOSIS — N912 Amenorrhea, unspecified: Secondary | ICD-10-CM

## 2014-12-10 DIAGNOSIS — N644 Mastodynia: Secondary | ICD-10-CM | POA: Insufficient documentation

## 2014-12-10 LAB — CBC
HEMATOCRIT: 39.6 % (ref 36.0–46.0)
HEMOGLOBIN: 13.2 g/dL (ref 12.0–15.0)
MCH: 28.9 pg (ref 26.0–34.0)
MCHC: 33.3 g/dL (ref 30.0–36.0)
MCV: 86.7 fL (ref 78.0–100.0)
Platelets: 209 10*3/uL (ref 150–400)
RBC: 4.57 MIL/uL (ref 3.87–5.11)
RDW: 13 % (ref 11.5–15.5)
WBC: 5.5 10*3/uL (ref 4.0–10.5)

## 2014-12-10 LAB — URINALYSIS, ROUTINE W REFLEX MICROSCOPIC
Bilirubin Urine: NEGATIVE
GLUCOSE, UA: NEGATIVE mg/dL
HGB URINE DIPSTICK: NEGATIVE
KETONES UR: NEGATIVE mg/dL
Leukocytes, UA: NEGATIVE
Nitrite: NEGATIVE
Protein, ur: NEGATIVE mg/dL
Specific Gravity, Urine: 1.025 (ref 1.005–1.030)
Urobilinogen, UA: 0.2 mg/dL (ref 0.0–1.0)
pH: 6 (ref 5.0–8.0)

## 2014-12-10 LAB — TSH: TSH: 0.913 u[IU]/mL (ref 0.350–4.500)

## 2014-12-10 LAB — POCT PREGNANCY, URINE: PREG TEST UR: NEGATIVE

## 2014-12-10 NOTE — MAU Provider Note (Signed)
History   [id number]   Chief Complaint  Patient presents with  . Breast Pain    HPI Connie Guzman is a 24 y.o. female  G0P0 here with report of bilateral breast pain for three weeks.  Denies breast discharge.  Also reports nausea that started 2 weeks.  Feels nauseas approximately one time per day.  No report of vomiting.  Intermittent episodes of dizziness, no dizziness present at this time. Headaches approximately one time per week.  Last depo provera injection in April, does not recall last LMP due to the injections.  Using depo provera x one year.    No LMP recorded. Patient has had an injection.  OB History  Gravida Para Term Preterm AB SAB TAB Ectopic Multiple Living  0         0        Past Medical History  Diagnosis Date  . Heart murmur   . Chlamydia   . Inflammation of cervix     Family History  Problem Relation Age of Onset  . Breast cancer Mother   . Breast cancer Maternal Grandmother   . Diabetes Maternal Grandmother   . Hyperlipidemia Maternal Grandmother   . Hypertension Maternal Grandmother   . Other Maternal Grandmother     fibroid uterus    Social History   Social History  . Marital Status: Single    Spouse Name: N/A  . Number of Children: N/A  . Years of Education: N/A   Social History Main Topics  . Smoking status: Never Smoker   . Smokeless tobacco: Never Used  . Alcohol Use: 0.6 oz/week    1 Glasses of wine per week     Comment: last drink Apr 11 2014  . Drug Use: Yes    Special: Marijuana     Comment: last use Apr 11 2014  . Sexual Activity: Yes    Birth Control/ Protection: None   Other Topics Concern  . None   Social History Narrative    Allergies  Allergen Reactions  . Chocolate Anaphylaxis, Hives and Other (See Comments)    Reaction:  Migraines and dizziness     No current facility-administered medications on file prior to encounter.   Current Outpatient Prescriptions on File Prior to Encounter  Medication Sig  Dispense Refill  . fluticasone (FLONASE) 50 MCG/ACT nasal spray Place 1 spray into both nostrils daily as needed for rhinitis.     Marland Kitchen gabapentin (NEURONTIN) 100 MG capsule Take 200 mg by mouth at bedtime as needed (for sleep/pain).     . ondansetron (ZOFRAN-ODT) 4 MG disintegrating tablet Take 4 mg by mouth every 8 (eight) hours as needed for nausea or vomiting.       Review of Systems  Gastrointestinal: Positive for nausea. Negative for vomiting.  Genitourinary: Negative for vaginal bleeding and pelvic pain.  Neurological: Positive for dizziness and headaches.  All other systems reviewed and are negative.    Physical Exam   Filed Vitals:   12/10/14 1945  BP: 110/71  Pulse: 80  Temp: 98.2 F (36.8 C)  TempSrc: Oral  Resp: 18  Height: 5' 5.7" (1.669 m)  Weight: 53.343 kg (117 lb 9.6 oz)  SpO2: 100%    Physical Exam  Constitutional: She is oriented to person, place, and time. She appears well-developed and well-nourished. No distress.  HENT:  Head: Normocephalic.  Eyes: Pupils are equal, round, and reactive to light.  Neck: Neck supple. No thyromegaly present.  Cardiovascular: Normal rate, regular rhythm  and normal heart sounds.   Respiratory: Effort normal and breath sounds normal. No respiratory distress. Right breast exhibits tenderness. Right breast exhibits no mass, no nipple discharge and no skin change. Left breast exhibits tenderness. Left breast exhibits no mass, no nipple discharge and no skin change.  Musculoskeletal: Normal range of motion. She exhibits no edema.  Neurological: She is alert and oriented to person, place, and time. She has normal reflexes.  Skin: Skin is warm and dry.    MAU Course  Procedures  MDM CBC and TSH ordered  Results for orders placed or performed during the hospital encounter of 12/10/14 (from the past 24 hour(s))  Urinalysis, Routine w reflex microscopic (not at [facility])     Status: None   Collection Time: 12/10/14  7:48 PM  Result  Value Ref Range   Color, Urine YELLOW YELLOW   APPearance CLEAR CLEAR   Specific Gravity, Urine 1.025 1.005 - 1.030   pH 6.0 5.0 - 8.0   Glucose, UA NEGATIVE NEGATIVE mg/dL   Hgb urine dipstick NEGATIVE NEGATIVE   Bilirubin Urine NEGATIVE NEGATIVE   Ketones, ur NEGATIVE NEGATIVE mg/dL   Protein, ur NEGATIVE NEGATIVE mg/dL   Urobilinogen, UA 0.2 0.0 - 1.0 mg/dL   Nitrite NEGATIVE NEGATIVE   Leukocytes, UA NEGATIVE NEGATIVE  Pregnancy, urine POC     Status: None   Collection Time: 12/10/14  7:57 PM  Result Value Ref Range   Preg Test, Ur NEGATIVE NEGATIVE  CBC     Status: None   Collection Time: 12/10/14  9:30 PM  Result Value Ref Range   WBC 5.5 4.0 - 10.5 K/uL   RBC 4.57 3.87 - 5.11 MIL/uL   Hemoglobin 13.2 12.0 - 15.0 g/dL   HCT 78.2 95.6 - 21.3 %   MCV 86.7 78.0 - 100.0 fL   MCH 28.9 26.0 - 34.0 pg   MCHC 33.3 30.0 - 36.0 g/dL   RDW 08.6 57.8 - 46.9 %   Platelets 209 150 - 400 K/uL    Assessment and Plan  Breast Pain Amenorrhea Secondary to DMPA use  Plan: TSH pending [place] [facility] [place] 12/10/2014 10:01 PM

## 2014-12-10 NOTE — Discharge Instructions (Signed)

## 2014-12-10 NOTE — MAU Note (Signed)
Pt states that she has had breast pain in both breasts for about 3 weeks. Has c/o nausea, back pain and episodes of dizziness, which is is not currently experiences. LMP: cannot remember. Stopped taking Depo-last shot in April. Had a negative home UPT last week.

## 2015-02-26 ENCOUNTER — Emergency Department
Admission: EM | Admit: 2015-02-26 | Discharge: 2015-02-26 | Disposition: A | Discharge status: Home / Self Care | Payer: BLUE CROSS/BLUE SHIELD | Attending: Emergency Medicine | Admitting: Emergency Medicine

## 2015-02-26 ENCOUNTER — Encounter: Payer: Self-pay | Admitting: Medical Oncology

## 2015-02-26 DIAGNOSIS — Z3202 Encounter for pregnancy test, result negative: Secondary | ICD-10-CM | POA: Diagnosis not present

## 2015-02-26 DIAGNOSIS — N939 Abnormal uterine and vaginal bleeding, unspecified: Secondary | ICD-10-CM | POA: Diagnosis not present

## 2015-02-26 DIAGNOSIS — N390 Urinary tract infection, site not specified: Secondary | ICD-10-CM | POA: Diagnosis not present

## 2015-02-26 HISTORY — DX: Anemia, unspecified: D64.9

## 2015-02-26 HISTORY — DX: Dysmenorrhea, unspecified: N94.6

## 2015-02-26 LAB — COMPREHENSIVE METABOLIC PANEL
ALBUMIN: 4.4 g/dL (ref 3.5–5.0)
ALK PHOS: 60 U/L (ref 38–126)
ALT: 21 U/L (ref 14–54)
AST: 28 U/L (ref 15–41)
Anion gap: 6 (ref 5–15)
BUN: 12 mg/dL (ref 6–20)
CALCIUM: 9.3 mg/dL (ref 8.9–10.3)
CHLORIDE: 108 mmol/L (ref 101–111)
CO2: 27 mmol/L (ref 22–32)
CREATININE: 0.53 mg/dL (ref 0.44–1.00)
GFR calc Af Amer: >60 mL/min (ref >60)
GFR calc non Af Amer: >60 mL/min (ref >60)
GLUCOSE: 94 mg/dL (ref 65–99)
Potassium: 3.8 mmol/L (ref 3.5–5.1)
SODIUM: 141 mmol/L (ref 135–145)
Total Bilirubin: 0.5 mg/dL (ref 0.3–1.2)
Total Protein: 8.1 g/dL (ref 6.5–8.1)

## 2015-02-26 LAB — URINALYSIS COMPLETE WITH MICROSCOPIC (ARMC ONLY)
Bilirubin Urine: NEGATIVE
Glucose, UA: NEGATIVE mg/dL
Ketones, ur: NEGATIVE mg/dL
Leukocytes, UA: NEGATIVE
NITRITE: NEGATIVE
PROTEIN: NEGATIVE mg/dL
SPECIFIC GRAVITY, URINE: 1.019 (ref 1.005–1.030)
pH: 6 (ref 5.0–8.0)

## 2015-02-26 LAB — CBC WITH DIFFERENTIAL/PLATELET
BASOS ABS: 0 10*3/uL (ref 0–0.1)
Basophils Relative: 1 %
EOS ABS: 0 10*3/uL (ref 0–0.7)
EOS PCT: 1 %
HCT: 40.5 % (ref 35.0–47.0)
HEMOGLOBIN: 13.3 g/dL (ref 12.0–16.0)
LYMPHS ABS: 3.1 10*3/uL (ref 1.0–3.6)
Lymphocytes Relative: 43 %
MCH: 28.6 pg (ref 26.0–34.0)
MCHC: 32.8 g/dL (ref 32.0–36.0)
MCV: 87.1 fL (ref 80.0–100.0)
Monocytes Absolute: 0.7 10*3/uL (ref 0.2–0.9)
Monocytes Relative: 9 %
NEUTROS PCT: 46 %
Neutro Abs: 3.3 10*3/uL (ref 1.4–6.5)
PLATELETS: 208 10*3/uL (ref 150–440)
RBC: 4.65 MIL/uL (ref 3.80–5.20)
RDW: 12.6 % (ref 11.5–14.5)
WBC: 7.1 10*3/uL (ref 3.6–11.0)

## 2015-02-26 LAB — POCT PREGNANCY, URINE: Preg Test, Ur: NEGATIVE

## 2015-02-26 LAB — PREGNANCY, URINE: PREG TEST UR: NEGATIVE

## 2015-02-26 LAB — HCG, QUANTITATIVE, PREGNANCY

## 2015-02-26 MED ORDER — OXYCODONE-ACETAMINOPHEN 5-325 MG PO TABS
2.0000 | ORAL_TABLET | Freq: Once | ORAL | Status: AC
  Administered 2015-02-26: 2 via ORAL
  Filled 2015-02-26: qty 2

## 2015-02-26 MED ORDER — NITROFURANTOIN MONOHYD MACRO 100 MG PO CAPS: 100.0000 mg | ORAL_CAPSULE | Freq: Two times a day (BID) | ORAL | Status: DC

## 2015-02-26 MED ORDER — ONDANSETRON 4 MG PO TBDP
4.0000 mg | ORAL_TABLET | Freq: Once | ORAL | Status: AC
  Administered 2015-02-26: 4 mg via ORAL
  Filled 2015-02-26: qty 1

## 2015-02-26 NOTE — ED Notes (Signed)
Pt reports that she has had her normal period for the month on 11/8 and Saturday she began having some vaginal bleeding with lower abd cramping.

## 2015-02-26 NOTE — Discharge Instructions (Signed)
Abnormal Uterine Bleeding Abnormal uterine bleeding means bleeding from the vagina that is not your normal menstrual period. This can be:  Bleeding or spotting between periods.  Bleeding after sex (sexual intercourse).  Bleeding that is heavier or more than normal.  Periods that last longer than usual.  Bleeding after menopause. There are many problems that may cause this. Treatment will depend on the cause of the bleeding. Any kind of bleeding that is not normal should be reviewed by your doctor.  HOME CARE Watch your condition for any changes. These actions may lessen any discomfort you are having:  Do not use tampons or douches as told by your doctor.  Change your pads often. You should get regular pelvic exams and Pap tests. Keep all appointments for tests as told by your doctor. GET HELP IF:  You are bleeding for more than 1 week.  You feel dizzy at times. GET HELP RIGHT AWAY IF:   You pass out.  You have to change pads every 15 to 30 minutes.  You have belly pain.  You have a fever.  You become sweaty or weak.  You are passing large blood clots from the vagina.  You feel sick to your stomach (nauseous) and throw up (vomit). MAKE SURE YOU:  Understand these instructions.  Will watch your condition.  Will get help right away if you are not doing well or get worse.   This information is not intended to replace advice given to you by your health care provider. Make sure you discuss any questions you have with your health care provider.   Document Released: 01/12/2009 Document Revised: 03/22/2013 Document Reviewed: 10/14/2012 Elsevier Interactive Patient Education 2016 Elsevier Inc.  Urinary Tract Infection A urinary tract infection (UTI) can occur any place along the urinary tract. The tract includes the kidneys, ureters, bladder, and urethra. A type of germ called bacteria often causes a UTI. UTIs are often helped with antibiotic medicine.  HOME CARE   If  given, take antibiotics as told by your doctor. Finish them even if you start to feel better.  Drink enough fluids to keep your pee (urine) clear or pale yellow.  Avoid tea, drinks with caffeine, and bubbly (carbonated) drinks.  Pee often. Avoid holding your pee in for a long time.  Pee before and after having sex (intercourse).  Wipe from front to back after you poop (bowel movement) if you are a woman. Use each tissue only once. GET HELP RIGHT AWAY IF:   You have back pain.  You have lower belly (abdominal) pain.  You have chills.  You feel sick to your stomach (nauseous).  You throw up (vomit).  Your burning or discomfort with peeing does not go away.  You have a fever.  Your symptoms are not better in 3 days. MAKE SURE YOU:   Understand these instructions.  Will watch your condition.  Will get help right away if you are not doing well or get worse.   This information is not intended to replace advice given to you by your health care provider. Make sure you discuss any questions you have with your health care provider.   Document Released: 09/03/2007 Document Revised: 04/07/2014 Document Reviewed: 10/16/2011 Elsevier Interactive Patient Education [employer] Elsevier Inc.

## 2015-02-26 NOTE — ED Provider Notes (Signed)
[facility] Emergency Department Provider Note     Time seen: ----------------------------------------- 8:48 AM on 02/26/2015 -----------------------------------------    I have reviewed the triage vital signs and the nursing notes.   HISTORY  Chief Complaint Vaginal Bleeding    HPI Kaylor Samuel BoucheLucas is a [age] y.o. female who presents ER for vaginal bleeding with cramping.Patient states she had a normal period this month and then Saturday began having some vaginal bleeding. Patient reports sexual activity to days prior to the bleeding. She is having lower abdominal cramping, is trying to get pregnant right now. She denies fevers chills or other complaints.   Past Medical History  Diagnosis Date  . Heart murmur   . Chlamydia   . Inflammation of cervix   . Dysmenorrhea   . Anemia     Patient Active Problem List   Diagnosis Date Noted  . Irregular menstrual cycle 08/06/2012  . Inflammation of cervix     Past Surgical History  Procedure Laterality Date  . Wisdom tooth extraction  2012  . Tonsillectomy and adenoidectomy  2009  . Tubes in ears      Allergies Chocolate  Social History Social History  Substance Use Topics  . Smoking status: Never Smoker   . Smokeless tobacco: Never Used  . Alcohol Use: 0.6 oz/week    1 Glasses of wine per week     Comment: last drink Apr 11 2014    Review of Systems Constitutional: Negative for fever. Eyes: Negative for visual changes. ENT: Negative for sore throat. Cardiovascular: Negative for chest pain. Respiratory: Negative for shortness of breath. Gastrointestinal: Positive for abdominal pain Genitourinary: Negative for dysuria. Positive vaginal bleeding Musculoskeletal: Negative for back pain. Skin: Negative for rash. Neurological: Negative for headaches, focal weakness or numbness.  10-point ROS otherwise negative.  ____________________________________________   PHYSICAL EXAM:  VITAL  SIGNS: ED Triage Vitals  Enc Vitals Group     BP 02/26/15 0844 114/68 mmHg     Pulse Rate 02/26/15 0844 85     Resp 02/26/15 0844 18     Temp 02/26/15 0844 97.6 F (36.4 C)     Temp Source 02/26/15 0844 Oral     SpO2 02/26/15 0844 100 %     Weight 02/26/15 0844 128 lb (58.06 kg)     Height 02/26/15 0844 5\' 7"  (1.702 m)     Head Cir --      Peak Flow --      Pain Score 02/26/15 0845 10     Pain Loc --      Pain Edu? --      Excl. in GC? --     Constitutional: Alert and oriented. Well appearing and in no distress. Eyes: Conjunctivae are normal. PERRL. Normal extraocular movements. ENT   Head: Normocephalic and atraumatic.   Nose: No congestion/rhinnorhea.   Mouth/Throat: Mucous membranes are moist.   Neck: No stridor. Cardiovascular: Normal rate, regular rhythm. Normal and symmetric distal pulses are present in all extremities. No murmurs, rubs, or gallops. Respiratory: Normal respiratory effort without tachypnea nor retractions. Breath sounds are clear and equal bilaterally. No wheezes/rales/rhonchi. Gastrointestinal: Soft and nontender. No distention. No abdominal bruits.  Musculoskeletal: Nontender with normal range of motion in all extremities. No joint effusions.  No lower extremity tenderness nor edema. Neurologic:  Normal speech and language. No gross focal neurologic deficits are appreciated. Speech is normal. No gait instability. Skin:  Skin is warm, dry and intact. No rash noted. Psychiatric: Mood and affect  are normal. Speech and behavior are normal. Patient exhibits appropriate insight and judgment.  ____________________________________________  ED COURSE:  Pertinent labs & imaging results that were available during my care of the patient were reviewed by me and considered in my medical decision making (see chart for details). Patient with abnormal vaginal bleeding, unclear etiology. We'll check basic labs were evaluated.   ____________________________________________    LABS (pertinent positives/negatives)  Labs Reviewed  URINALYSIS COMPLETEWITH MICROSCOPIC ([facility] ONLY) - Abnormal; Notable for the following:    Color, Urine YELLOW (*)    APPearance HAZY (*)    Hgb urine dipstick 3+ (*)    Bacteria, UA FEW (*)    Squamous Epithelial / LPF 6-30 (*)    All other components within normal limits  CBC WITH DIFFERENTIAL/PLATELET  COMPREHENSIVE METABOLIC PANEL  PREGNANCY, URINE  HCG, QUANTITATIVE, PREGNANCY  POCT PREGNANCY, URINE    ____________________________________________  FINAL ASSESSMENT AND PLAN  Abnormal vaginal bleeding  Plan: Patient with labs as dictated above. Patient is in no acute distress, I offered oral contraceptive pills but she wants to be pregnant. She is referred outpatient OB/GYN for follow-up.   Emily Filbert, MD   Emily Filbert, MD 02/26/15 [phone]

## 2015-05-28 ENCOUNTER — Emergency Department
Admission: EM | Admit: 2015-05-28 | Discharge: 2015-05-28 | Disposition: A | Discharge status: Home / Self Care | Payer: BLUE CROSS/BLUE SHIELD | Attending: Emergency Medicine | Admitting: Emergency Medicine

## 2015-05-28 ENCOUNTER — Encounter: Payer: Self-pay | Admitting: *Deleted

## 2015-05-28 DIAGNOSIS — L0211 Cutaneous abscess of neck: Secondary | ICD-10-CM | POA: Insufficient documentation

## 2015-05-28 DIAGNOSIS — N926 Irregular menstruation, unspecified: Secondary | ICD-10-CM | POA: Diagnosis not present

## 2015-05-28 DIAGNOSIS — Z3202 Encounter for pregnancy test, result negative: Secondary | ICD-10-CM | POA: Insufficient documentation

## 2015-05-28 DIAGNOSIS — L0291 Cutaneous abscess, unspecified: Secondary | ICD-10-CM

## 2015-05-28 LAB — BASIC METABOLIC PANEL
ANION GAP: 7 (ref 5–15)
BUN: 10 mg/dL (ref 6–20)
CHLORIDE: 108 mmol/L (ref 101–111)
CO2: 23 mmol/L (ref 22–32)
Calcium: 9 mg/dL (ref 8.9–10.3)
Creatinine, Ser: 0.56 mg/dL (ref 0.44–1.00)
GFR calc Af Amer: >60 mL/min (ref >60)
GLUCOSE: 80 mg/dL (ref 65–99)
POTASSIUM: 3.4 mmol/L — AB (ref 3.5–5.1)
Sodium: 138 mmol/L (ref 135–145)

## 2015-05-28 LAB — POCT PREGNANCY, URINE: Preg Test, Ur: NEGATIVE

## 2015-05-28 LAB — CBC WITH DIFFERENTIAL/PLATELET
BASOS ABS: 0 10*3/uL (ref 0–0.1)
Basophils Relative: 1 %
EOS PCT: 0 %
Eosinophils Absolute: 0 10*3/uL (ref 0–0.7)
HEMATOCRIT: 40.2 % (ref 35.0–47.0)
Hemoglobin: 13.3 g/dL (ref 12.0–16.0)
LYMPHS ABS: 2.1 10*3/uL (ref 1.0–3.6)
LYMPHS PCT: 28 %
MCH: 28.9 pg (ref 26.0–34.0)
MCHC: 33.1 g/dL (ref 32.0–36.0)
MCV: 87.3 fL (ref 80.0–100.0)
Monocytes Absolute: 0.6 10*3/uL (ref 0.2–0.9)
Monocytes Relative: 8 %
NEUTROS ABS: 4.7 10*3/uL (ref 1.4–6.5)
Neutrophils Relative %: 63 %
PLATELETS: 177 10*3/uL (ref 150–440)
RBC: 4.61 MIL/uL (ref 3.80–5.20)
RDW: 12.5 % (ref 11.5–14.5)
WBC: 7.4 10*3/uL (ref 3.6–11.0)

## 2015-05-28 MED ORDER — OXYCODONE-ACETAMINOPHEN 5-325 MG PO TABS: 2.0000 | ORAL_TABLET | Freq: Four times a day (QID) | ORAL | Status: DC | PRN

## 2015-05-28 MED ORDER — ACETAMINOPHEN 500 MG PO TABS
1000.0000 mg | ORAL_TABLET | Freq: Once | ORAL | Status: AC
  Administered 2015-05-28: 1000 mg via ORAL
  Filled 2015-05-28: qty 2

## 2015-05-28 MED ORDER — NORGESTIM-ETH ESTRAD TRIPHASIC 0.18/0.215/0.25 MG-35 MCG PO TABS: 1.0000 | ORAL_TABLET | Freq: Every day | ORAL | Status: DC

## 2015-05-28 MED ORDER — LIDOCAINE-EPINEPHRINE (PF) 2 %-1:200000 IJ SOLN: 10.0000 mL | Freq: Once | INTRAMUSCULAR | Status: DC

## 2015-05-28 MED ORDER — LIDOCAINE-EPINEPHRINE 1 %-1:100000 IJ SOLN
10.0000 mL | Freq: Once | INTRAMUSCULAR | Status: DC
  Filled 2015-05-28: qty 10

## 2015-05-28 MED ORDER — MUPIROCIN 2 % EX OINT: TOPICAL_OINTMENT | CUTANEOUS | Status: AC

## 2015-05-28 MED ORDER — OXYCODONE-ACETAMINOPHEN 5-325 MG PO TABS
2.0000 | ORAL_TABLET | Freq: Once | ORAL | Status: AC
  Filled 2015-05-28: qty 2

## 2015-05-28 MED ORDER — LIDOCAINE-EPINEPHRINE (PF) 1 %-1:200000 IJ SOLN
INTRAMUSCULAR | Status: AC
  Administered 2015-05-28: 19:00:00
  Filled 2015-05-28: qty 30

## 2015-05-28 NOTE — ED Notes (Signed)
States cyst on her throat area denies any trouble swallowing, in no distress speaking in full clear sentances, also states last period was 12/28, denies any cramping, states she took a pregnancy test at home and it was negative

## 2015-05-28 NOTE — ED Notes (Signed)
Pt in via triage w/ complaints of abscess to throat x "a few days".  Pt with complaints of pain to area, states it was more swollen prior to now, reports using a hot compress which helped.  Pt denies any difficulty swallowing.  Pt A/Ox4, vitals WDL, no immediate distress at this time.

## 2015-05-28 NOTE — Discharge Instructions (Signed)
Abscess An abscess is an infected area that contains a collection of pus and debris.It can occur in almost any part of the body. An abscess is also known as a furuncle or boil. CAUSES  An abscess occurs when tissue gets infected. This can occur from blockage of oil or sweat glands, infection of hair follicles, or a minor injury to the skin. As the body tries to fight the infection, pus collects in the area and creates pressure under the skin. This pressure causes pain. People with weakened immune systems have difficulty fighting infections and get certain abscesses more often.  SYMPTOMS Usually an abscess develops on the skin and becomes a painful mass that is red, warm, and tender. If the abscess forms under the skin, you may feel a moveable soft area under the skin. Some abscesses break open (rupture) on their own, but most will continue to get worse without care. The infection can spread deeper into the body and eventually into the bloodstream, causing you to feel ill.  DIAGNOSIS  Your caregiver will take your medical history and perform a physical exam. A sample of fluid may also be taken from the abscess to determine what is causing your infection. TREATMENT  Your caregiver may prescribe antibiotic medicines to fight the infection. However, taking antibiotics alone usually does not cure an abscess. Your caregiver may need to make a small cut (incision) in the abscess to drain the pus. In some cases, gauze is packed into the abscess to reduce pain and to continue draining the area. HOME CARE INSTRUCTIONS   Only take over-the-counter or prescription medicines for pain, discomfort, or fever as directed by your caregiver.  If you were prescribed antibiotics, take them as directed. Finish them even if you start to feel better.  If gauze is used, follow your caregiver's directions for changing the gauze.  To avoid spreading the infection:  Keep your draining abscess covered with a  bandage.  Wash your hands well.  Do not share personal care items, towels, or whirlpools with others.  Avoid skin contact with others.  Keep your skin and clothes clean around the abscess.  Keep all follow-up appointments as directed by your caregiver. SEEK MEDICAL CARE IF:   You have increased pain, swelling, redness, fluid drainage, or bleeding.  You have muscle aches, chills, or a general ill feeling.  You have a fever. MAKE SURE YOU:   Understand these instructions.  Will watch your condition.  Will get help right away if you are not doing well or get worse.   This information is not intended to replace advice given to you by your health care provider. Make sure you discuss any questions you have with your health care provider.   Document Released: 12/25/2004 Document Revised: 09/16/2011 Document Reviewed: 05/30/2011 Elsevier Interactive Patient Education 2016 [employer].  Menstruation Menstruation is the monthly passing of blood, tissue, fluid, and mucus. It is also known as a period. Your body is shedding the lining of the uterus. The flow of blood usually occurs during 3-7 consecutive days each month. Hormones control the menstrual cycle. Hormones are a chemical substance produced by endocrine glands in the body to regulate different bodily functions. The first menstrual period may start any time between age 36 years to 16 years. However, it usually starts around age 70 years. Some girls have regular monthly menstrual cycles right from the beginning. However, it is not unusual to have only a couple of drops of blood or spotting when you  first start menstruating. It is also not unusual to have two periods a month or miss a month or two when first starting your periods. SYMPTOMS   Mild to moderate abdominal cramps.  Aching or pain in the lower back area. Symptoms may occur 5-10 days before your menstrual period starts. These symptoms are referred to as premenstrual  syndrome (PMS). These symptoms can include:  Headache.  Breast tenderness and swelling.  Bloating.  Tiredness (fatigue).  Mood changes.  Craving for certain foods. These are normal signs and symptoms and can vary in severity. To help relieve these problems, ask your caregiver if you can take over-the-counter medications for pain or discomfort. If the symptoms are not controllable, see your caregiver for help.  HORMONES INVOLVED IN MENSTRUATION Menstruation comes about because of hormones produced by the pituitary gland in the brain and the ovaries that affect the uterine lining. First, the pituitary gland in the brain produces the hormone follicle stimulating hormone [facility]). FSH stimulates the ovaries to produce estrogen, which thickens the uterine lining and begins to develop an egg in the ovary. About 14 days later, the pituitary gland produces another hormone called luteinizing hormone (LH). LH causes the egg to come out of a sac in the ovary (ovulation). The empty sac on the ovary called the corpus luteum is stimulated by another hormone from the pituitary gland called luteotropin. The corpus luteum begins to produce the estrogen and progesterone hormone. The progesterone hormone prepares the lining of the uterus to have the fertilized egg (egg combined with sperm) attach to the lining of the uterus and begin to develop into a fetus. If the egg is not fertilized, the corpus luteum stops producing estrogen and progesterone, it disappears, the lining of the uterus sloughs off and a menstrual period begins. Then the menstrual cycle starts all over again and will continue monthly unless pregnancy occurs or menopause begins. The secretion of hormones is complex. Various parts of the body become involved in many chemical activities. Female sex hormones have other functions in a woman's body as well. Estrogen increases a woman's sex drive (libido). It naturally helps body get rid of fluids (diuretic).  It also aids in the process of building new bone. Therefore, maintaining hormonal health is essential to all levels of a woman's well being. These hormones are usually present in normal amounts and cause you to menstruate. It is the relationship between the (small) levels of the hormones that is critical. When the balance is upset, menstrual irregularities can occur. HOW DOES THE MENSTRUAL CYCLE HAPPEN?  Menstrual cycles vary in length from 21-35 days with an average of 29 days. The cycle begins on the first day of bleeding. At this time, the pituitary gland in the brain releases FSH that travels through the bloodstream to the ovaries. The [facility] stimulates the follicles in the ovaries. This prepares the body for ovulation that occurs around the 14th day of the cycle. The ovaries produce estrogen, and this makes sure conditions are right in the uterus for implantation of the fertilized egg.  When the levels of estrogen reach a high enough level, it signals the gland in the brain (pituitary gland) to release a surge of LH. This causes the release of the ripest egg from its follicle (ovulation). Usually only one follicle releases one egg, but sometimes more than one follicle releases an egg especially when stimulating the ovaries for in vitro fertilization. The egg can then be collected by either fallopian tube to await fertilization.  The burst follicle within the ovary that is left behind is now called the corpus luteum or "yellow body." The corpus luteum continues to give off (secrete) reduced amounts of estrogen. This closes and hardens the cervix. It dries up the mucus to the naturally infertile condition.  The corpus luteum also begins to give off greater amounts of progesterone. This causes the lining of the uterus (endometrium) to thicken even more in preparation for the fertilized egg. The egg is starting to journey down from the fallopian tube to the uterus. It also signals the ovaries to stop releasing  eggs. It assists in returning the cervical mucus to its infertile state.  If the egg implants successfully into the womb lining and pregnancy occurs, progesterone levels will continue to raise. It is often this hormone that gives some pregnant women a feeling of well being, like a "natural high." Progesterone levels drop again after childbirth.  If fertilization does not occur, the corpus luteum dies, stopping the production of hormones. This sudden drop in progesterone causes the uterine lining to break down, accompanied by blood (menstruation).  This starts the cycle back at day 1. The whole process starts all over again. Woman go through this cycle every month from puberty to menopause. Women have breaks only for pregnancy and breastfeeding (lactation), unless the woman has health problems that affect the female hormone system or chooses to use oral contraceptives to have unnatural menstrual periods. HOME CARE INSTRUCTIONS   Keep track of your periods by using a calendar.  If you use tampons, get the least absorbent to avoid toxic shock syndrome.  Do not leave tampons in the vagina over night or longer than 6 hours.  Wear a sanitary pad over night.  Exercise 3-5 times a week or more.  Avoid foods and drinks that you know will make your symptoms worse before or during your period. SEEK MEDICAL CARE IF:   You develop a fever with your period.  Your periods are lasting more than 7 days.  Your period is so heavy that you have to change pads or tampons every 30 minutes.  You develop clots with your period and never had clots before.  You cannot get relief from over-the-counter medication for your symptoms.  Your period has not started, and it has been longer than 35 days.   This information is not intended to replace advice given to you by your health care provider. Make sure you discuss any questions you have with your health care provider.   Document Released: 03/07/2002 Document  Revised: 12/06/2014 Document Reviewed: 10/14/2012 Elsevier Interactive Patient Education [employer]

## 2015-05-28 NOTE — ED Provider Notes (Signed)
[facility] Emergency Department Provider Note     Time seen: ----------------------------------------- 6:40 PM on 05/28/2015 -----------------------------------------    I have reviewed the triage vital signs and the nursing notes.   HISTORY  Chief Complaint Cyst    HPI Connie Guzman is a 25 y.o. female who presents to ER for an abscess on her neck for the last several days. Patient claims pain to the area, states is more swollen she's been using hot compresses without any improvement. Patient denies any difficulty swallowing. Patient also states her menstrual cycles been irregular. Home pregnant test was negative.   Past Medical History  Diagnosis Date  . Heart murmur   . Chlamydia   . Inflammation of cervix   . Dysmenorrhea   . Anemia     Patient Active Problem List   Diagnosis Date Noted  . Irregular menstrual cycle 08/06/2012  . Inflammation of cervix     Past Surgical History  Procedure Laterality Date  . Wisdom tooth extraction  2012  . Tonsillectomy and adenoidectomy  2009  . Tubes in ears      Allergies Chocolate  Social History Social History  Substance Use Topics  . Smoking status: Never Smoker   . Smokeless tobacco: Never Used  . Alcohol Use: 0.6 oz/week    1 Glasses of wine per week     Comment: last drink Apr 11 2014    Review of Systems Constitutional: Negative for fever. Eyes: Negative for visual changes. ENT: Negative for sore throat. Cardiovascular: Negative for chest pain. Respiratory: Negative for shortness of breath. Gastrointestinal: Negative for abdominal pain, vomiting and diarrhea. Genitourinary: Negative for dysuria. Musculoskeletal: Negative for back pain. Skin: Positive for abscess anteriorly on the neck neurological: Negative for headaches, focal weakness or numbness.  10-point ROS otherwise negative.  ____________________________________________   PHYSICAL EXAM:  VITAL SIGNS: ED  Triage Vitals  Enc Vitals Group     BP 05/28/15 1741 121/83 mmHg     Pulse Rate 05/28/15 1741 88     Resp 05/28/15 1741 16     Temp 05/28/15 1741 98.4 F (36.9 C)     Temp Source 05/28/15 1741 Oral     SpO2 05/28/15 1741 100 %     Weight 05/28/15 1741 113 lb (51.256 kg)     Height 05/28/15 1741  (1.702 m)     Head Cir --      Peak Flow --      Pain Score 05/28/15 1747 8     Pain Loc --      Pain Edu? --      Excl. in GC? --     Constitutional: Alert and oriented. Well appearing and in no distress. Eyes: Conjunctivae are normal. PERRL. Normal extraocular movements. ENT   Head: Normocephalic and atraumatic.   Nose: No congestion/rhinnorhea.   Mouth/Throat: Mucous membranes are moist.   Neck: No stridor. small approximately 2.5 cm abscess noted over the prominence of the thyroid cartilage  Cardiovascular: Normal rate, regular rhythm. Normal and symmetric distal pulses are present in all extremities. No murmurs, rubs, or gallops. Respiratory: Normal respiratory effort without tachypnea nor retractions. Breath sounds are clear and equal bilaterally. No wheezes/rales/rhonchi. Gastrointestinal: Soft and nontender. No distention. No abdominal bruits.  Musculoskeletal: Nontender with normal range of motion in all extremities. No joint effusions.  No lower extremity tenderness nor edema. Neurologic:  Normal speech and language. No gross focal neurologic deficits are appreciated. Speech is normal. No gait instability. Skin:Small  abscesses noted over the thyroid cartilage. Psychiatric: Mood and affect are normal. Speech and behavior are normal. Patient exhibits appropriate insight and judgment. ____________________________________________  ED COURSE:  Pertinent labs & imaging results that were available during my care of the patient were reviewed by me and considered in my medical decision making (see chart for details).  patient will need incision and drainage, I'll check  basic labs.  ____________________________________________   INCISION AND DRAINAGE Performed by: Daryel November E Consent: Verbal consent obtained. Risks and benefits: risks, benefits and alternatives were discussed Type: abscess  Body area: Neck  Anesthesia: local infiltration  Incision was made with a scalpel.  Local anesthetic: lidocaine 1 % with epinephrine  Anesthetic total: 2 ml  Complexity: complex Blunt dissection to break up loculations  Drainage: purulent  Drainage amount: Small   Packing material: 1/4 in iodoform gauze  Patient tolerance: Patient tolerated the procedure well with no immediate complications.    LABS (pertinent positives/negatives)  Labs Reviewed  CBC WITH DIFFERENTIAL/PLATELET  BASIC METABOLIC PANEL  POCT PREGNANCY, URINE  POC URINE PREG, ED  ____________________________________________  FINAL ASSESSMENT AND PLAN   Menstrual cycle irregularity, skin abscess status post incision and drainage   Plan: Patient with labs as dictated above.  Offered her birth control here, she'll be discharged with topical antibiotic ointment and follow up as needed in terms of the skin abscess. She is encouraged to follow-up with OB/GYN for her hormonal imbalance.   Emily Filbert, MD   Emily Filbert, MD 05/28/15 [phone]

## 2015-09-02 LAB — HM PAP SMEAR

## 2016-11-20 ENCOUNTER — Encounter: Payer: Self-pay | Admitting: Physician Assistant

## 2016-11-20 ENCOUNTER — Ambulatory Visit (INDEPENDENT_AMBULATORY_CARE_PROVIDER_SITE_OTHER): Payer: Managed Care, Other (non HMO) | Admitting: Physician Assistant

## 2016-11-20 VITALS — BP 108/70 | HR 64 | Temp 98.3°F | Resp 16 | Ht 67.0 in | Wt 117.0 lb

## 2016-11-20 DIAGNOSIS — Z13220 Encounter for screening for lipoid disorders: Secondary | ICD-10-CM | POA: Diagnosis not present

## 2016-11-20 DIAGNOSIS — G43009 Migraine without aura, not intractable, without status migrainosus: Secondary | ICD-10-CM | POA: Diagnosis not present

## 2016-11-20 DIAGNOSIS — Z131 Encounter for screening for diabetes mellitus: Secondary | ICD-10-CM

## 2016-11-20 DIAGNOSIS — Z1329 Encounter for screening for other suspected endocrine disorder: Secondary | ICD-10-CM

## 2016-11-20 DIAGNOSIS — N926 Irregular menstruation, unspecified: Secondary | ICD-10-CM | POA: Diagnosis not present

## 2016-11-20 DIAGNOSIS — Z0000 Encounter for general adult medical examination without abnormal findings: Secondary | ICD-10-CM | POA: Diagnosis not present

## 2016-11-20 DIAGNOSIS — Z803 Family history of malignant neoplasm of breast: Secondary | ICD-10-CM | POA: Diagnosis not present

## 2016-11-20 DIAGNOSIS — Z833 Family history of diabetes mellitus: Secondary | ICD-10-CM | POA: Diagnosis not present

## 2016-11-20 DIAGNOSIS — Z9889 Other specified postprocedural states: Secondary | ICD-10-CM | POA: Diagnosis not present

## 2016-11-20 LAB — POCT URINE PREGNANCY: Preg Test, Ur: NEGATIVE

## 2016-11-20 MED ORDER — VITAMIN B-2 100 MG PO TABS: 100.0000 mg | ORAL_TABLET | Freq: Every day | ORAL | 0 refills | Status: DC

## 2016-11-20 MED ORDER — MAGNESIUM OXIDE 400 MG PO TABS: 400.0000 mg | ORAL_TABLET | Freq: Every day | ORAL | 0 refills | Status: DC

## 2016-11-20 NOTE — Patient Instructions (Signed)
Preparing for Pregnancy If you are considering becoming pregnant, make an appointment to see your regular health care provider to learn how to prepare for a safe and healthy pregnancy (preconception care). During a preconception care visit, your health care provider will:  Do a complete physical exam, including a Pap test.  Take a complete medical history.  Give you information, answer your questions, and help you resolve problems.  Preconception checklist Medical history  Tell your health care provider about any current or past medical conditions. Your pregnancy or your ability to become pregnant may be affected by chronic conditions, such as diabetes, chronic hypertension, and thyroid problems.  Include your family's medical history as well as your partner's medical history.  Tell your health care provider about any history of STIs (sexually transmitted infections).These can affect your pregnancy. In some cases, they can be passed to your baby. Discuss any concerns that you have about STIs.  If indicated, discuss the benefits of genetic testing. This testing will show whether there are any genetic conditions that may be passed from you or your partner to your baby.  Tell your health care provider about: ? Any problems you have had with conception or pregnancy. ? Any medicines you take. These include vitamins, herbal supplements, and over-the-counter medicines. ? Your history of immunizations. Discuss any vaccinations that you may need.  Diet  Ask your health care provider what to include in a healthy diet that has a balance of nutrients. This is especially important when you are pregnant or preparing to become pregnant.  Ask your health care provider to help you reach a healthy weight before pregnancy. ? If you are overweight, you may be at higher risk for certain complications, such as high blood pressure, diabetes, and preterm birth. ? If you are underweight, you are more likely  to have a baby who has a low birth weight.  Lifestyle, work, and home  Let your health care provider know: ? About any lifestyle habits that you have, such as alcohol use, drug use, or smoking. ? About recreational activities that may put you at risk during pregnancy, such as downhill skiing and certain exercise programs. ? Tell your health care provider about any international travel, especially any travel to places with an active Zika virus outbreak. ? About harmful substances that you may be exposed to at work or at home. These include chemicals, pesticides, radiation, or even litter boxes. ? If you do not feel safe at home.  Mental health  Tell your health care provider about: ? Any history of mental health conditions, including feelings of depression, sadness, or anxiety. ? Any medicines that you take for a mental health condition. These include herbs and supplements.  Home instructions to prepare for pregnancy Lifestyle  Eat a balanced diet. This includes fresh fruits and vegetables, whole grains, lean meats, low-fat dairy products, healthy fats, and foods that are high in fiber. Ask to meet with a nutritionist or registered dietitian for assistance with meal planning and goals.  Get regular exercise. Try to be active for at least 30 minutes a day on most days of the week. Ask your health care provider which activities are safe during pregnancy.  Do not use any products that contain nicotine or tobacco, such as cigarettes and e-cigarettes. If you need help quitting, ask your health care provider.  Do not drink alcohol.  Do not take illegal drugs.  Maintain a healthy weight. Ask your health care provider what weight range is   right for you.  General instructions  Keep an accurate record of your menstrual periods. This makes it easier for your health care provider to determine your baby's due date.  Begin taking prenatal vitamins and folic acid supplements daily as directed by  your health care provider.  Manage any chronic conditions, such as high blood pressure and diabetes, as told by your health care provider. This is important.  How do I know that I am pregnant? You may be pregnant if you have been sexually active and you miss your period. Symptoms of early pregnancy include:  Mild cramping.  Very light vaginal bleeding (spotting).  Feeling unusually tired.  Nausea and vomiting (morning sickness).  If you have any of these symptoms and you suspect that you might be pregnant, you can take a home pregnancy test. These tests check for a hormone in your urine (human chorionic gonadotropin, or hCG). A woman's body begins to make this hormone during early pregnancy. These tests are very accurate. Wait until at least the first day after you miss your period to take one. If the test shows that you are pregnant (you get a positive result), call your health care provider to make an appointment for prenatal care. What should I do if I become pregnant?  Make an appointment with your health care provider as soon as you suspect you are pregnant.  Do not use any products that contain nicotine, such as cigarettes, chewing tobacco, and e-cigarettes. If you need help quitting, ask your health care provider.  Do not drink alcoholic beverages. Alcohol is related to a number of birth defects.  Avoid toxic odors and chemicals.  You may continue to have sexual intercourse if it does not cause pain or other problems, such as vaginal bleeding. This information is not intended to replace advice given to you by your health care provider. Make sure you discuss any questions you have with your health care provider. Document Released: 02/28/2008 Document Revised: 11/13/2015 Document Reviewed: 10/07/2015 Elsevier Interactive Patient Education  2017 Elsevier Inc.  

## 2016-11-20 NOTE — Progress Notes (Signed)
Patient: Connie Guzman Female    DOB: 09/10/1990   26 y.o.   MRN: [id number] Visit Date: 11/20/2016  Today's Provider: Trey Sailors, PA-C   No chief complaint on file.  Subjective:      Connie Guzman is a 26 y/o woman presenting today to establish care. She was previously seen at [facility] and [facility].   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 She lives in [place] and has been married for two years. No children. Currently sexually active, not using contraception. Attempting pregnancy.   Does not smoke cigarette. A couple of drinks a month. Smokes marijuana.   Has irregular menses. Wants to address this further. Wants blood pregnancy test.   Has history of Abnormal PAP smear and a LEEP, November 2017. Has had HPV vaccine.   Has history of migraines and increased migraines with pertinent below.   Mom living, strained relationship, mostly healthy - HTN. Dad deceased - no relationship  Breast cancer: mom and grandmother - early 62's, 52's respectively Colon cancer: none   Headache   This is a recurrent problem. The current episode started more than 1 month ago. The problem occurs daily (Pt wakes up with a headache.). The pain is located in the temporal, bilateral and frontal region. The pain does not radiate. The pain quality is not similar to prior headaches. The quality of the pain is described as sharp. The pain is at a severity of 6/10 (Pain level with a headache is generally around 7-8). Associated symptoms include dizziness, nausea (comes and goes with the headaches.), phonophobia, photophobia and sinus pressure. Pertinent negatives include no abdominal pain, coughing, eye pain, eye redness, fever, numbness, scalp tenderness, seizures, vomiting or weakness.       Allergies  Allergen Reactions  . Chocolate Anaphylaxis, Hives and Other (See Comments)    Reaction:  Migraines and  dizziness      Current Outpatient Prescriptions:  .  acetaminophen (TYLENOL) 500 MG tablet, Take 1,000 mg by mouth every 6 (six) hours as needed for mild pain or headache., Disp: , Rfl:  .  fluticasone (FLONASE) 50 MCG/ACT nasal spray, Place 1 spray into both nostrils daily as needed for rhinitis. , Disp: , Rfl:  .  loratadine (CLARITIN) 10 MG tablet, Take 10 mg by mouth daily as needed for allergies., Disp: , Rfl:  .  magnesium oxide (MAG-OX) 400 MG tablet, Take 1 tablet (400 mg total) by mouth daily., Disp: 90 tablet, Rfl: 0 .  ondansetron (ZOFRAN-ODT) 4 MG disintegrating tablet, Take by mouth., Disp: , Rfl:  .  Prenatal Vit-Min-FA-Fish Oil ([employer] PRENATAL GUMMY PO), Take 2 each by mouth daily., Disp: , Rfl:  .  riboflavin (VITAMIN B-2) 100 MG TABS tablet, Take 1 tablet (100 mg total) by mouth daily., Disp: 90 tablet, Rfl: 0  Review of Systems  Constitutional: Positive for appetite change and fatigue. Negative for activity change, chills, diaphoresis, fever and unexpected weight change.  HENT: Positive for sinus pain and sinus pressure.   Eyes: Positive for photophobia. Negative for pain, discharge, redness, itching and visual disturbance.  Respiratory: Negative for apnea, cough, choking, chest tightness, shortness of breath, wheezing and stridor.   Gastrointestinal: Positive for constipation and nausea (comes and goes with the headaches.). Negative for abdominal distention, abdominal pain, anal bleeding, blood in stool, diarrhea, rectal pain and vomiting.  Endocrine: Positive for heat intolerance and polydipsia.  Negative for cold intolerance, polyphagia and polyuria.  Genitourinary: Positive for vaginal bleeding. Negative for decreased urine volume, difficulty urinating, dyspareunia, dysuria, enuresis, flank pain, frequency, genital sores, hematuria, menstrual problem, pelvic pain, urgency, vaginal discharge and vaginal pain.  Skin: Negative.   Neurological: Positive for dizziness,  light-headedness and headaches. Negative for tremors, seizures, syncope, facial asymmetry, speech difficulty, weakness and numbness.  Hematological: Does not bruise/bleed easily.  Psychiatric/Behavioral: Positive for sleep disturbance. Negative for agitation, behavioral problems, confusion, decreased concentration, dysphoric mood, hallucinations, self-injury and suicidal ideas. The patient is nervous/anxious. The patient is not hyperactive.     Social History  Substance Use Topics  . Smoking status: Never Smoker  . Smokeless tobacco: Never Used  . Alcohol use Yes     Comment: Rarely   Objective:   BP 108/70 (BP Location: Left Arm, Patient Position: Sitting, Cuff Size: Normal)   Pulse 64   Temp 98.3 F (36.8 C) (Oral)   Resp 16   Ht 5\' 7"  (1.702 m)   Wt 117 lb (53.1 kg)   LMP 10/13/2016   BMI 18.32 kg/m  Vitals:   11/20/16 1011  BP: 108/70  Pulse: 64  Resp: 16  Temp: 98.3 F (36.8 C)  TempSrc: Oral  Weight: 117 lb (53.1 kg)  Height: 5\' 7"  (1.702 m)     Physical Exam  Constitutional: She is oriented to person, place, and time. She appears well-developed and well-nourished.  HENT:  Right Ear: External ear normal.  Left Ear: External ear normal.  Mouth/Throat: Oropharynx is clear and moist. No oropharyngeal exudate.  Eyes: Pupils are equal, round, and reactive to light. Conjunctivae are normal.  Neck: Neck supple.  Small right cervical lymph node. Mobile, tender.   Cardiovascular: Normal rate and regular rhythm.   Pulmonary/Chest: Effort normal and breath sounds normal.  Abdominal: Soft. Bowel sounds are normal.  Lymphadenopathy:    She has cervical adenopathy.  Neurological: She is alert and oriented to person, place, and time.  Skin: Skin is warm and dry.  Psychiatric: She has a normal mood and affect. Her behavior is normal.        Assessment & Plan:     1. Annual physical exam  Call if lymph node not improving. Likely infectious. Counseled on taking  prenatal vitamins and gener preconception.  - CBC With Differential  2. History of loop electrical excision procedure (LEEP)  - Ambulatory referral to Obstetrics / Gynecology  3. Diabetes mellitus screening  - Comprehensive Metabolic Panel (CMET) - Hemoglobin A1c  4. Irregular menses  Urine pregnancy negative. Patient requests HCG.  - Ambulatory referral to Obstetrics / Gynecology - Beta HCG, Quant - POCT urine pregnancy  5. Lipid screening  - Lipid Panel w/o Chol/HDL Ratio  6. Thyroid disorder screening  - TSH  7. Migraine without aura and without status migrainosus, not intractable  Prophylaxis safe in pregnancy.   - riboflavin (VITAMIN B-2) 100 MG TABS tablet; Take 1 tablet (100 mg total) by mouth daily.  Dispense: 90 tablet; Refill: 0 - magnesium oxide (MAG-OX) 400 MG tablet; Take 1 tablet (400 mg total) by mouth daily.  Dispense: 90 tablet; Refill: 0  8. Family history of diabetes mellitus  - Hemoglobin A1c  9. Family history of breast cancer  May consider genetic testing with gynecology.  Return in about 1 year (around 11/20/2017) for cpe.  The entirety of the information documented in the History of Present Illness, Review of Systems and Physical Exam were personally obtained by me.  Portions of this information were initially documented by Ashley Royalty, CMA and reviewed by me for thoroughness and accuracy.        Trinna Post, PA-C  [facility]

## 2016-11-22 LAB — BETA HCG QUANT (REF LAB): hCG Quant: <1 m[IU]/mL

## 2016-11-22 LAB — COMPREHENSIVE METABOLIC PANEL
ALT: 11 IU/L (ref 0–32)
AST: 15 IU/L (ref 0–40)
Albumin/Globulin Ratio: 1.6 (ref 1.2–2.2)
Albumin: 4.2 g/dL (ref 3.5–5.5)
Alkaline Phosphatase: 51 IU/L (ref 39–117)
BUN/Creatinine Ratio: 11 (ref 9–23)
BUN: 7 mg/dL (ref 6–20)
Bilirubin Total: 0.9 mg/dL (ref 0.0–1.2)
CO2: 24 mmol/L (ref 20–29)
Calcium: 9.1 mg/dL (ref 8.7–10.2)
Chloride: 104 mmol/L (ref 96–106)
Creatinine, Ser: 0.61 mg/dL (ref 0.57–1.00)
GFR calc Af Amer: 145 mL/min/{1.73_m2} (ref >59)
GFR calc non Af Amer: 126 mL/min/{1.73_m2} (ref >59)
Globulin, Total: 2.6 g/dL (ref 1.5–4.5)
Glucose: 81 mg/dL (ref 65–99)
Potassium: 4.2 mmol/L (ref 3.5–5.2)
Sodium: 141 mmol/L (ref 134–144)
Total Protein: 6.8 g/dL (ref 6.0–8.5)

## 2016-11-22 LAB — CBC WITH DIFFERENTIAL
Basophils Absolute: 0 10*3/uL (ref 0.0–0.2)
Basos: 1 %
EOS (ABSOLUTE): 0 10*3/uL (ref 0.0–0.4)
Eos: 1 %
Hematocrit: 37.3 % (ref 34.0–46.6)
Hemoglobin: 12.6 g/dL (ref 11.1–15.9)
Immature Grans (Abs): 0 10*3/uL (ref 0.0–0.1)
Immature Granulocytes: 0 %
Lymphocytes Absolute: 2 10*3/uL (ref 0.7–3.1)
Lymphs: 51 %
MCH: 28.4 pg (ref 26.6–33.0)
MCHC: 33.8 g/dL (ref 31.5–35.7)
MCV: 84 fL (ref 79–97)
Monocytes Absolute: 0.3 10*3/uL (ref 0.1–0.9)
Monocytes: 7 %
Neutrophils Absolute: 1.6 10*3/uL (ref 1.4–7.0)
Neutrophils: 40 %
RBC: 4.44 x10E6/uL (ref 3.77–5.28)
RDW: 12.7 % (ref 12.3–15.4)
WBC: 3.9 10*3/uL (ref 3.4–10.8)

## 2016-11-22 LAB — LIPID PANEL W/O CHOL/HDL RATIO
Cholesterol, Total: 109 mg/dL (ref 100–199)
HDL: 49 mg/dL (ref >39)
LDL Calculated: 50 mg/dL (ref 0–99)
Triglycerides: 50 mg/dL (ref 0–149)
VLDL Cholesterol Cal: 10 mg/dL (ref 5–40)

## 2016-11-22 LAB — TSH: TSH: 0.625 u[IU]/mL (ref 0.450–4.500)

## 2016-11-22 LAB — HEMOGLOBIN A1C
Est. average glucose Bld gHb Est-mCnc: 91 mg/dL
Hgb A1c MFr Bld: 4.8 % (ref 4.8–5.6)

## 2016-11-26 ENCOUNTER — Telehealth: Payer: Self-pay

## 2016-11-26 NOTE — Telephone Encounter (Signed)
-----   Message from Trey SailorsAdriana M Pollak, [place]PA-C sent at 11/24/2016  1:12 PM EDT ----- Labs all normal. HCG shows not pregnant.

## 2016-11-26 NOTE — Telephone Encounter (Signed)
LMTCB 11/26/2016  Thanks,   -Laura  

## 2016-11-28 NOTE — Telephone Encounter (Signed)
LMTCB 11/28/2016  Thanks,   -Devynn Hessler  

## 2016-12-08 NOTE — Telephone Encounter (Signed)
Pt advised.   Thanks,   -Laura  

## 2016-12-10 ENCOUNTER — Encounter: Payer: Self-pay | Admitting: Physician Assistant

## 2016-12-24 ENCOUNTER — Encounter: Payer: Self-pay | Admitting: Obstetrics and Gynecology

## 2016-12-24 ENCOUNTER — Ambulatory Visit (INDEPENDENT_AMBULATORY_CARE_PROVIDER_SITE_OTHER): Payer: Managed Care, Other (non HMO) | Admitting: Obstetrics and Gynecology

## 2016-12-24 VITALS — BP 105/65 | HR 73 | Ht 67.0 in | Wt 119.5 lb

## 2016-12-24 DIAGNOSIS — N941 Unspecified dyspareunia: Secondary | ICD-10-CM | POA: Diagnosis not present

## 2016-12-24 DIAGNOSIS — Z3189 Encounter for other procreative management: Secondary | ICD-10-CM | POA: Diagnosis not present

## 2016-12-24 DIAGNOSIS — N946 Dysmenorrhea, unspecified: Secondary | ICD-10-CM | POA: Diagnosis not present

## 2016-12-24 NOTE — Patient Instructions (Signed)
  Pelvic Pain, Female Pelvic pain is pain in your lower abdomen, below your belly button and between your hips. The pain may start suddenly (acute), keep coming back (recurring), or last a long time (chronic). Pelvic pain that lasts longer than six months is considered chronic. Pelvic pain may affect your:  Reproductive organs.  Urinary system.  Digestive tract.  Musculoskeletal system.  There are many potential causes of pelvic pain. Sometimes, the pain can be a result of digestive or urinary conditions, strained muscles or ligaments, or even reproductive conditions. Sometimes the cause of pelvic pain is not known. Follow these instructions at home:  Take over-the-counter and prescription medicines only as told by your health care provider.  Rest as told by your health care provider.  Do not have sex it if hurts.  Keep a journal of your pelvic pain. Write down: ? When the pain started. ? Where the pain is located. ? What seems to make the pain better or worse, such as food or your menstrual cycle. ? Any symptoms you have along with the pain.  Keep all follow-up visits as told by your health care provider. This is important. Contact a health care provider if:  Medicine does not help your pain.  Your pain comes back.  You have new symptoms.  You have abnormal vaginal discharge or bleeding, including bleeding after menopause.  You have a fever or chills.  You are constipated.  You have blood in your urine or stool.  You have foul-smelling urine.  You feel weak or lightheaded. Get help right away if:  You have sudden severe pain.  Your pain gets steadily worse.  You have severe pain along with fever, nausea, vomiting, or excessive sweating.  You lose consciousness. This information is not intended to replace advice given to you by your health care provider. Make sure you discuss any questions you have with your health care provider. Document Released:  02/12/2004 Document Revised: 04/11/2015 Document Reviewed: 01/05/2015 Elsevier Interactive Patient Education  2018 Elsevier Inc. Endometriosis Endometriosis is a condition in which the tissue that lines the uterus (endometrium) grows outside of its normal location. The tissue may grow in many locations close to the uterus, but it commonly grows on the ovaries, fallopian tubes, vagina, or bowel. When the uterus sheds the endometrium every menstrual cycle, there is bleeding wherever the endometrial tissue is located. This can cause pain because blood is irritating to tissues that are not normally exposed to it. What are the causes? The cause of endometriosis is not known. What increases the risk? You may be more likely to develop endometriosis if you:  Have a family history of endometriosis.  Have never given birth.  Started your period at age 10 or younger.  Have high levels of estrogen in your body.  Were exposed to a certain medicine (diethylstilbestrol) before you were born (in utero).  Had low birth weight.  Were born as a twin, triplet, or other multiple.  Have a BMI of less than 25. BMI is an estimate of body fat and is calculated from height and weight.  What are the signs or symptoms? Often, there are no symptoms of this condition. If you do have symptoms, they may:  Vary depending on where your endometrial tissue is growing.  Occur during your menstrual period (most common) or midcycle.  Come and go, or you may go months with no symptoms at all.  Stop with menopause.  Symptoms may include:  Pain in the   back or abdomen.  Heavier bleeding during periods.  Pain during sex.  Painful bowel movements.  Infertility.  Pelvic pain.  Bleeding more than once a month.  How is this diagnosed? This condition is diagnosed based on your symptoms and a physical exam. You may have tests, such as:  Blood tests and urine tests. These may be done to help rule out other  possible causes of your symptoms.  Ultrasound, to look for abnormal tissues.  An X-ray of the lower bowel (barium enema).  An ultrasound that is done through the vagina (transvaginally).  CT scan.  MRI.  Laparoscopy. In this procedure, a lighted, pencil-sized instrument called a laparoscope is inserted into your abdomen through an incision. The laparoscope allows your health care provider to look at the organs inside your body and check for abnormal tissue to confirm the diagnosis. If abnormal tissue is found, your health care provider may remove a small piece of tissue (biopsy) to be examined under a microscope.  How is this treated? Treatment for this condition may include:  Medicines to relieve pain, such as NSAIDs.  Hormone therapy. This involves using artificial (synthetic) hormones to reduce endometrial tissue growth. Your health care provider may recommend using a hormonal form of birth control, or other medicines.  Surgery. This may be done to remove abnormal endometrial tissue. ? In some cases, tissue may be removed using a laparoscope and a laser (laparoscopic laser treatment). ? In severe cases, surgery may be done to remove the fallopian tubes, uterus, and ovaries (hysterectomy).  Follow these instructions at home:  Take over-the-counter and prescription medicines only as told by your health care provider.  Do not drive or use heavy machinery while taking prescription pain medicine.  Try to avoid activities that cause pain, including sexual activity.  Keep all follow-up visits as told by your health care provider. This is important. Contact a health care provider if:  You have pain in the area between your hip bones (pelvic area) that occurs: ? Before, during, or after your period. ? In between your period and gets worse during your period. ? During or after sex. ? With bowel movements or urination, especially during your period.  You have problems getting  pregnant.  You have a fever. Get help right away if:  You have severe pain that does not get better with medicine.  You have severe nausea and vomiting, or you cannot eat without vomiting.  You have pain that affects only the lower, right side of your abdomen.  You have abdominal pain that gets worse.  You have abdominal swelling.  You have blood in your stool. This information is not intended to replace advice given to you by your health care provider. Make sure you discuss any questions you have with your health care provider. Document Released: 03/14/2000 Document Revised: 12/21/2015 Document Reviewed: 08/18/2015 Elsevier Interactive Patient Education  2018 Elsevier Inc.  

## 2016-12-24 NOTE — Progress Notes (Signed)
GYNECOLOGY PROGRESS NOTE  Subjective:    Patient ID: Keylen Uzelac, female    DOB: [birth year], 26 y.o.   MRN: [id number]  HPI  Patient is a 26 y.o. G0P0 female who presents for discussion of irregular periods.  Is concerned that due to her symptoms she may not be able to conceive. Patient states that her periods are usually 4-5 days, with moderate to heavy flow.  Notes that over the past year her periods have decreased in flow and in length, now only lasting 2-3 days.  Notes that periods however have ben becoming more painful. Reports sharp pain that radiates from left to right.  Intercourse is sometimes painful, with deep dyspareunia.  Periods are occurring at least every 28-35 days.   Of note, patient has been with her partner for several years.  Notes that they have tried off and on for approximately 1 year, and then discontinued attempts.  She nor partner have conceived in prior relationships. Partner is ~ 37 yrs old.     Menstrual History: OB History    Gravida Para Term Preterm AB Living   0         0   SAB TAB Ectopic Multiple Live Births                  Menarche age: 17 Patient's last menstrual period was 12/22/2016. Period Cycle (Days): 27 Period Duration (Days): 4 Period Pattern: Regular Menstrual Flow: Moderate Dysmenorrhea: (!) Severe Dysmenorrhea Symptoms: Cramping, Diarrhea, Headache, Nausea  Last pap smear: 2016, normal, performed at [facility].  Has h/o abnormal pap smear in the past,with LEEP.    Past Medical History:  Diagnosis Date  . Abnormal Pap smear of cervix   . Anemia   . Chlamydia   . Dysmenorrhea   . Heart murmur   . Inflammation of cervix     Family History  Problem Relation Age of Onset  . Breast cancer Mother   . Hypertension Mother   . Breast cancer Maternal Grandmother   . Diabetes Maternal Grandmother   . Hyperlipidemia Maternal Grandmother   . Hypertension Maternal Grandmother   . Other Maternal Grandmother         fibroid uterus  . Bipolar disorder Sister   . Diabetes Sister        Borderline  . Bipolar disorder Sister     Past Surgical History:  Procedure Laterality Date  . LEEP    . TONSILLECTOMY AND ADENOIDECTOMY  2009  . tubes in ears    . WISDOM TOOTH EXTRACTION  2012    Social History   Social History  . Marital status: Married    Spouse name: N/A  . Number of children: N/A  . Years of education: N/A   Occupational History  . Not on file.   Social History Main Topics  . Smoking status: Never Smoker  . Smokeless tobacco: Never Used  . Alcohol use Yes     Comment: Rarely  . Drug use: Yes    Types: Marijuana     Comment: Last used 09/2016  . Sexual activity: Yes    Partners: Male    Birth control/ protection: None   Other Topics Concern  . Not on file   Social History Narrative  . No narrative on file    Current Outpatient Prescriptions on File Prior to Visit  Medication Sig Dispense Refill  . acetaminophen (TYLENOL) 500 MG tablet Take 1,000 mg by mouth every 6 (  six) hours as needed for mild pain or headache.     No current facility-administered medications on file prior to visit.     Allergies  Allergen Reactions  . Chocolate Anaphylaxis, Hives and Other (See Comments)    Reaction:  Migraines and dizziness      Review of Systems Pertinent items noted in HPI and remainder of comprehensive ROS otherwise negative.   Objective:   Blood pressure 105/65, pulse 73, height 5' 7" (1.702 m), weight 119 lb 8 oz (54.2 kg), last menstrual period 12/22/2016. General appearance: alert and no distress Abdomen: soft, non-tender; bowel sounds normal; no masses,  no organomegaly Pelvic: external genitalia normal, rectovaginal septum normal.  Vagina without discharge.  Cervix normal appearing, no lesions.  Motion tenderness noted.   Uterus mobile, nontender, retroflexed, normal shape and size.  Adnexae non-palpable, nontender bilaterally.  Extremities: extremities  normal, atraumatic, no cyanosis or edema Neurologic: Grossly normal   Assessment:   Dysmenorrhea Dyspareunia Fertility planning  Plan:   - Discussed with patient that although her cycles are coming every 27-35 days, this is still considered regular.  Her cycles have changed in length and flow, but this does not necessarily mean that her fertility is affected. Advised patient that if she is concerned about ovulation, she could chart basal body temps or could use an ovulation kit. Also discussed tracking periods on app, timed coitus during fertile week, and remaining supine for at least 15-30 minutes post coitus. Patient inquires as to whether semen analysis is warranted. Advised patient that it is indicated if no other causes are diagnosed.  - Discussed symptoms of dysmenorrhea and dyspareunia, symptoms may be related to possible endometriosis diagnosis. Discussed how endometriosis is diagnosed and treated. As patient is currently trying to conceive, would not recommend standard treatments of hormonal regulation (i.e. OCPs, IUD).  Discussed fertility with relation to endometriosis. Advised that if infertility diagnosed, I would recommend a laparoscopy with biopsies to assess for endometriosis, as well as chromotubation to assess for patency of tubes (due to patient's h/o STI).  Will order pelvic ultrasound to assess for any other causes of pain and survey pelvic anatomy.  - RTC in 1-2 weeks for ultrasound, and 4 weeks with MD.    Rubie Maid, MD [facility]

## 2016-12-26 ENCOUNTER — Other Ambulatory Visit: Payer: Self-pay | Admitting: Obstetrics and Gynecology

## 2016-12-26 DIAGNOSIS — N946 Dysmenorrhea, unspecified: Secondary | ICD-10-CM

## 2016-12-30 ENCOUNTER — Ambulatory Visit (INDEPENDENT_AMBULATORY_CARE_PROVIDER_SITE_OTHER): Payer: Managed Care, Other (non HMO)

## 2016-12-30 DIAGNOSIS — N946 Dysmenorrhea, unspecified: Secondary | ICD-10-CM | POA: Diagnosis not present

## 2017-01-27 ENCOUNTER — Encounter: Payer: Self-pay | Admitting: Obstetrics and Gynecology

## 2017-01-27 ENCOUNTER — Ambulatory Visit (INDEPENDENT_AMBULATORY_CARE_PROVIDER_SITE_OTHER): Payer: Managed Care, Other (non HMO) | Admitting: Obstetrics and Gynecology

## 2017-01-27 VITALS — BP 97/63 | HR 71 | Ht 67.0 in | Wt 121.3 lb

## 2017-01-27 DIAGNOSIS — Z319 Encounter for procreative management, unspecified: Secondary | ICD-10-CM

## 2017-01-27 DIAGNOSIS — Z8619 Personal history of other infectious and parasitic diseases: Secondary | ICD-10-CM | POA: Diagnosis not present

## 2017-01-27 NOTE — Patient Instructions (Addendum)
Hysterosalpingography Hysterosalpingography is a procedure to look inside your uterus and fallopian tubes. During this procedure, contrast dye is injected into your uterus through your vagina and cervix to illuminate your uterus while X-ray pictures are taken. This procedure may help your health care provider determine whether you have uterine tumors, adhesions, or structural abnormalities. It is commonly used to help determine why a woman is unable to have children (infertility). The procedure usually lasts about 15-30 minutes. LET YOUR HEALTH CARE PROVIDER KNOW ABOUT:  Any allergies you have.  All medicines you are taking, including vitamins, herbs, eye drops, creams, and over-the-counter medicines.  Previous problems you or members of your family have had with the use of anesthetics.  Any blood disorders you have.  Previous surgeries you have had.  Medical conditions you have. RISKS AND COMPLICATIONS Generally, this is a safe procedure. However, as with any procedure, problems can occur. Possible problems include:  Infection in the lining of the uterus (endometritis) or fallopian tubes (salpingitis).  Damage or perforation of the uterus or fallopian tubes.  An allergic reaction to the contrast dye used to perform the X-ray.  BEFORE THE PROCEDURE  Schedule the procedure after your period stops, but before your next ovulation. This is usually between day 5 and day 10 of your last period. Day 1 is the first day of your period.  Ask your health care provider about changing or stopping your regular medicines.  You may eat and drink as normal.  Empty your bladder before the procedure begins. PROCEDURE  You may be given a medicine to relax you (sedative) or an over-the-counter pain medicine to lessen any discomfort during the procedure.  You will lie down on an X-ray table with your feet in stirrups.  A device called a speculum will be placed into your vagina. This allows your  health care provider to see inside your vagina to the cervix.  The cervix will be washed with a special soap.  A thin, flexible tube will be passed through the cervix into the uterus.  Contrast dye will be put into this tube.  Several X-rays will be taken as the contrast dye spreads through the uterus and fallopian tubes.  The tube will be taken out after the procedure. What to expect after the procedure  Most of the contrast dye will flow out of your vagina naturally. You may want to wear a sanitary pad.  You may feel mild cramping and notice a little bleeding from your vagina. This should go away in 24 hours.  Ask when your test results will be ready. Make sure you get your test results. This information is not intended to replace advice given to you by your health care provider. Make sure you discuss any questions you have with your health care provider. Document Released: 04/19/2004 Document Revised: 08/23/2015 Document Reviewed: 09/17/2012 Elsevier Interactive Patient Education  2017 Elsevier Inc.  

## 2017-01-27 NOTE — Progress Notes (Signed)
    GYNECOLOGY PROGRESS NOTE  Subjective:    Patient ID: Connie Guzman, female    DOB: 1990/05/04, 26 y.o.   MRN: [id number]  HPI  Patient is a 26 y.o. G0P0 female who presents for f/u of ultrasound results and further management of fertility.  Denies complaints today.   The following portions of the patient's history were reviewed and updated as appropriate: allergies, current medications, past family history, past medical history, past social history, past surgical history and problem list.  Review of Systems Pertinent items noted in HPI and remainder of comprehensive ROS otherwise negative.   Objective:   Blood pressure 97/63, pulse 71, height '5\' 7"'$  (1.702 m), weight 121 lb 4.8 oz (55 kg), last menstrual period 01/25/2017. General appearance: alert and no distress Exam deferred.    Imaging:  ULTRASOUND REPORT  Location: [facility] Date of Service: 12/30/16   Indications: Dyspareunia Findings:  The uterus is anteflexed and measures 7.5 x 4.2 x 5.4 cm. Echo texture is homogenous without evidence of focal masses.  The Endometrium measures 5.5 mm.  Right Ovary measures 3.9 x 2.2 x 3.2 cm. Multiple follicles are seen and appears WNL. Left Ovary measures 3.7 x 1.8 x 2.6 cm. Multiple follicles seen and appears WNL. Survey of the adnexa demonstrates no adnexal masses. There is no free fluid in the cul de sac.  Impression: 1. Normal appearing pelvic ultrasound.  Recommendations: 1.Clinical correlation with the patient's History and Physical Exam.  Macarthur Critchley, RDMS, RVT   Assessment:   Primary infertility H/o chlamydia  Plan:   1. Primary infertility - normal appearing ultrasound with no structural defects. Labs performed last visit with no evidence of PCOS.  Patient notes that she used an ovulation kit last month, which noted that she ovulated around Day#15 or 16.  Encouraged patient to continue use.  Discussed next steps of  evaluation, including HSG (due to h/o chlamydia) to check for tubal patency, possible semen analysis of partner if all of her testing is negative.  Patient wishes to proceed.  Can also consider ovulation induction medications if all testing negative.  May also require referral to specialist.  2. H/o chlamydia, will assess tubal patency with HSG. Discussed nature of procedure. Given handout.  Advised on Ibuprofen 600-800 mg prior to procedure.   Will f/u after HSG, and semen analysis if performed to determine next steps.    A total of 15 minutes were spent face-to-face with the patient during this encounter and over half of that time dealt with counseling and coordination of care.  Rubie Maid, MD [facility]

## 2017-02-02 ENCOUNTER — Encounter: Payer: Self-pay | Admitting: Obstetrics and Gynecology

## 2017-02-02 ENCOUNTER — Ambulatory Visit
Admission: RE | Admit: 2017-02-02 | Discharge: 2017-02-02 | Disposition: A | Discharge status: Home / Self Care | Payer: Managed Care, Other (non HMO) | Source: Ambulatory Visit | Attending: Obstetrics and Gynecology | Admitting: Obstetrics and Gynecology

## 2017-02-02 DIAGNOSIS — Z8619 Personal history of other infectious and parasitic diseases: Secondary | ICD-10-CM | POA: Diagnosis not present

## 2017-02-02 DIAGNOSIS — Z319 Encounter for procreative management, unspecified: Secondary | ICD-10-CM

## 2017-02-02 MED ORDER — IBUPROFEN 800 MG PO TABS: 800.0000 mg | ORAL_TABLET | Freq: Three times a day (TID) | ORAL | 1 refills | Status: DC | PRN

## 2017-02-02 MED ORDER — IOPAMIDOL (ISOVUE-370) INJECTION 76%
20.0000 mL | Freq: Once | INTRAVENOUS | Status: AC | PRN
  Administered 2017-02-02: 20 mL

## 2017-02-02 NOTE — Discharge Instructions (Signed)
Hysterosalpingography, Care After  Refer to this sheet in the next few weeks. These instructions provide you with information on caring for yourself after your procedure. Your health care provider may also give you more specific instructions. Your treatment has been planned according to current medical practices, but problems sometimes occur. Call your health care provider if you have any problems or questions after your procedure.  What can I expect after the procedure?  After your procedure, it is typical to have the following:  · Cramping and spotting. This should go away in 24 hours.  · Contrast dye naturally flowing out of your vagina. You may want to wear a sanitary pad.    Follow these instructions at home:  · Only take medicines as directed by your health care provider.  · Wear a sanitary pad to catch any dye that will flow out naturally, if necessary. Change your sanitary pad each time it becomes wet or soiled.  · Do not douche or have sexual intercourse until your health care provider says it is okay.  · Ask when your test results will be ready. Make sure you get your test results.  · If you get an abnormal result, your health care provider may prescribe an antibiotic medicine. Take your antibiotics as directed. Finish them even if you start to feel better.  Contact a health care provider if:  · You have a fever.  · You have chills.  · Your skin is itchy, swollen, or has a rash.  · You have a bad smelling discharge coming from your vagina.  · You have vaginal bleeding that lasts more than 4 days.  Get help right away if:  · You have nausea and vomiting.  · You have heavy vaginal bleeding, soaking more than one pad every hour.  · You have severe abdominal pain.  This information is not intended to replace advice given to you by your health care provider. Make sure you discuss any questions you have with your health care provider.  Document Released: 06/09/2011 Document Revised: 08/23/2015 Document  Reviewed: 09/17/2012  Elsevier Interactive Patient Education © 2017 Elsevier Inc.

## 2017-03-03 ENCOUNTER — Encounter: Payer: Self-pay | Admitting: Physician Assistant

## 2017-03-03 ENCOUNTER — Ambulatory Visit (INDEPENDENT_AMBULATORY_CARE_PROVIDER_SITE_OTHER): Payer: Managed Care, Other (non HMO) | Admitting: Physician Assistant

## 2017-03-03 VITALS — BP 102/68 | HR 72 | Temp 98.1°F | Resp 16 | Wt 119.0 lb

## 2017-03-03 DIAGNOSIS — G43809 Other migraine, not intractable, without status migrainosus: Secondary | ICD-10-CM | POA: Diagnosis not present

## 2017-03-03 DIAGNOSIS — R110 Nausea: Secondary | ICD-10-CM

## 2017-03-03 DIAGNOSIS — N912 Amenorrhea, unspecified: Secondary | ICD-10-CM | POA: Diagnosis not present

## 2017-03-03 LAB — POCT URINE PREGNANCY: Preg Test, Ur: NEGATIVE

## 2017-03-03 MED ORDER — ONDANSETRON 4 MG PO TBDP: 4.0000 mg | ORAL_TABLET | Freq: Three times a day (TID) | ORAL | 0 refills | Status: DC | PRN

## 2017-03-03 NOTE — Progress Notes (Signed)
Patient: Connie Guzman Female    DOB: May 28, 1990   26 y.o.   MRN: [id number] Visit Date: 03/03/2017  Today's Provider: Trey SailorsAdriana M Adrieanna Boteler, PA-C   Chief Complaint  Patient presents with  . Migraine    Worsening in the last week.    Subjective:    Connie Caprice Samuel BoucheLucas is a 26 y/o woman presenting today for migraine. She has a history of migraines she mentioned at last visit. Did not start magnesium oxide or B2. She is nauseated. Has taken ibuprofen and Tylenol. She reports having been helped by in the past by Zofran. Also reports last menstrual period 01/21/2017. She is not preventing pregnancy.    Migraine   This is a recurrent problem. The current episode started in the past 7 days. The pain is located in the bilateral and frontal region. The pain does not radiate. The pain quality is similar to prior headaches. The quality of the pain is described as throbbing and aching. The pain is at a severity of 6/10. Associated symptoms include a loss of balance, nausea, phonophobia, photophobia, scalp tenderness (Pt reports her Scalp hurt last week but is better today.), a visual change and weakness. Pertinent negatives include no abdominal pain, dizziness, eye pain, eye redness, fever, neck pain, numbness, sinus pressure or vomiting. The symptoms are aggravated by bright light. She has tried acetaminophen, NSAIDs and cold packs (Rest, Drinking extra water. ) for the symptoms. The treatment provided no relief.       Allergies  Allergen Reactions  . Chocolate Anaphylaxis, Hives and Other (See Comments)    Reaction:  Migraines and dizziness      Current Outpatient Medications:  .  acetaminophen (TYLENOL) 500 MG tablet, Take 1,000 mg by mouth every 6 (six) hours as needed for mild pain or headache., Disp: , Rfl:  .  ibuprofen (ADVIL,MOTRIN) 800 MG tablet, Take 1 tablet (800 mg total) every 8 (eight) hours as needed by mouth., Disp: 60 tablet, Rfl: 1  Review of Systems    Constitutional: Positive for fatigue. Negative for activity change, appetite change, chills, diaphoresis, fever and unexpected weight change.  HENT: Negative for sinus pressure.   Eyes: Positive for photophobia and visual disturbance. Negative for pain, discharge, redness and itching.  Gastrointestinal: Positive for nausea. Negative for abdominal distention, abdominal pain, anal bleeding, blood in stool, constipation, diarrhea, rectal pain and vomiting.  Musculoskeletal: Positive for neck stiffness. Negative for neck pain.  Neurological: Positive for weakness, light-headedness, headaches and loss of balance. Negative for dizziness and numbness.    Social History   Tobacco Use  . Smoking status: Never Smoker  . Smokeless tobacco: Never Used  Substance Use Topics  . Alcohol use: Yes    Comment: Rarely   Objective:   BP 102/68 (BP Location: Right Arm, Patient Position: Sitting, Cuff Size: Normal)   Pulse 72   Temp 98.1 F (36.7 C) (Oral)   Resp 16   Wt 119 lb (54 kg)   LMP 01/25/2017   BMI 18.64 kg/m  Vitals:   03/03/17 0955  BP: 102/68  Pulse: 72  Resp: 16  Temp: 98.1 F (36.7 C)  TempSrc: Oral  Weight: 119 lb (54 kg)     Physical Exam  Constitutional: She is oriented to person, place, and time. She appears well-developed and well-nourished.  Eyes: Pupils are equal, round, and reactive to light.  Neurological: She is alert and oriented to person, place, and time. She has normal reflexes.  No cranial nerve deficit. Coordination normal.  Skin: Skin is warm and dry.  Psychiatric: She has a normal mood and affect. Her behavior is normal.        Assessment & Plan:     1. Other migraine without status migrainosus, not intractable  Still recommend B2 and mag oxide.   2. Nausea  - ondansetron (ZOFRAN-ODT) 4 MG disintegrating tablet; Take 1 tablet (4 mg total) by mouth every 8 (eight) hours as needed for nausea or vomiting.  Dispense: 20 tablet; Refill: 0  3.  Amenorrhea  Urine pregnancy negative.  - POCT urine pregnancy  Return if symptoms worsen or fail to improve.  The entirety of the information documented in the History of Present Illness, Review of Systems and Physical Exam were personally obtained by me. Portions of this information were initially documented by Kavin LeechLaura Walsh, CMA and reviewed by me for thoroughness and accuracy.         Trey SailorsAdriana M Jaritza Duignan, PA-C  [facility]

## 2017-03-03 NOTE — Patient Instructions (Signed)

## 2017-03-04 NOTE — Addendum Note (Signed)
Encounter addended by: Hildred Laserherry, Jailyne Chieffo, MD on: 03/04/2017 10:02 PM  Actions taken: Delete clinical note, Charge Capture section accepted

## 2017-04-07 ENCOUNTER — Ambulatory Visit: Payer: Managed Care, Other (non HMO) | Admitting: Physician Assistant

## 2017-04-07 ENCOUNTER — Encounter: Payer: Self-pay | Admitting: Emergency Medicine

## 2017-04-07 ENCOUNTER — Emergency Department
Admission: EM | Admit: 2017-04-07 | Discharge: 2017-04-07 | Disposition: A | Discharge status: Home / Self Care | Payer: 59 | Attending: Emergency Medicine | Admitting: Emergency Medicine

## 2017-04-07 DIAGNOSIS — R45851 Suicidal ideations: Secondary | ICD-10-CM | POA: Diagnosis not present

## 2017-04-07 DIAGNOSIS — F32A Depression, unspecified: Secondary | ICD-10-CM

## 2017-04-07 DIAGNOSIS — Z79899 Other long term (current) drug therapy: Secondary | ICD-10-CM | POA: Diagnosis not present

## 2017-04-07 DIAGNOSIS — F331 Major depressive disorder, recurrent, moderate: Secondary | ICD-10-CM

## 2017-04-07 DIAGNOSIS — F329 Major depressive disorder, single episode, unspecified: Secondary | ICD-10-CM | POA: Diagnosis present

## 2017-04-07 DIAGNOSIS — F4310 Post-traumatic stress disorder, unspecified: Secondary | ICD-10-CM

## 2017-04-07 LAB — COMPREHENSIVE METABOLIC PANEL
ALT: 17 U/L (ref 14–54)
AST: 23 U/L (ref 15–41)
Albumin: 4.7 g/dL (ref 3.5–5.0)
Alkaline Phosphatase: 60 U/L (ref 38–126)
Anion gap: 10 (ref 5–15)
BUN: 12 mg/dL (ref 6–20)
CO2: 25 mmol/L (ref 22–32)
CREATININE: 0.84 mg/dL (ref 0.44–1.00)
Calcium: 9.5 mg/dL (ref 8.9–10.3)
Chloride: 105 mmol/L (ref 101–111)
GFR calc Af Amer: >60 mL/min (ref >60)
Glucose, Bld: 105 mg/dL — ABNORMAL HIGH (ref 65–99)
Potassium: 4.4 mmol/L (ref 3.5–5.1)
Sodium: 140 mmol/L (ref 135–145)
Total Bilirubin: 0.9 mg/dL (ref 0.3–1.2)
Total Protein: 9 g/dL — ABNORMAL HIGH (ref 6.5–8.1)

## 2017-04-07 LAB — CBC
HEMATOCRIT: 43 % (ref 35.0–47.0)
Hemoglobin: 14.1 g/dL (ref 12.0–16.0)
MCH: 28.9 pg (ref 26.0–34.0)
MCHC: 32.8 g/dL (ref 32.0–36.0)
MCV: 88.3 fL (ref 80.0–100.0)
PLATELETS: 292 10*3/uL (ref 150–440)
RBC: 4.87 MIL/uL (ref 3.80–5.20)
RDW: 12.7 % (ref 11.5–14.5)
WBC: 9.4 10*3/uL (ref 3.6–11.0)

## 2017-04-07 LAB — ACETAMINOPHEN LEVEL: Acetaminophen (Tylenol), Serum: <10 ug/mL — ABNORMAL LOW (ref 10–30)

## 2017-04-07 LAB — SALICYLATE LEVEL: Salicylate Lvl: <7 mg/dL (ref 2.8–30.0)

## 2017-04-07 LAB — URINE DRUG SCREEN, QUALITATIVE (ARMC ONLY)
Amphetamines, Ur Screen: NOT DETECTED
Barbiturates, Ur Screen: NOT DETECTED
Benzodiazepine, Ur Scrn: NOT DETECTED
CANNABINOID 50 NG, UR ~~LOC~~: POSITIVE — AB
Cocaine Metabolite,Ur ~~LOC~~: NOT DETECTED
MDMA (Ecstasy)Ur Screen: NOT DETECTED
Methadone Scn, Ur: NOT DETECTED
OPIATE, UR SCREEN: NOT DETECTED
PHENCYCLIDINE (PCP) UR S: NOT DETECTED
Tricyclic, Ur Screen: NOT DETECTED

## 2017-04-07 LAB — ETHANOL: Alcohol, Ethyl (B): <10 mg/dL (ref <10)
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
## 2017-04-07 NOTE — ED Notes (Signed)
Dr. Toni Amendlapacs rescinded IVC @ 1425/ Baron SaneFelicia S. RN & ODS Bruna Pottereague are aware

## 2017-04-07 NOTE — ED Notes (Signed)
Ivc/ psych consult pending / dr / nurse/ and ODS  Aware of  ivc commitment
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
## 2017-04-07 NOTE — ED Notes (Signed)
1:1 sitter at bedside upon transfer to room 22. Remains at this time.

## 2017-04-07 NOTE — ED Notes (Addendum)
This tech [facility]'s [facility] and [facility] locker [facility];advised felicia, rn of this

## 2017-04-07 NOTE — ED Provider Notes (Signed)
Dr. Mat Carnelay packs a seen the patient we'll discharge them can follow-up with [facility] or the psychiatrist of their choice. Their diagnosis was depression.   Arnaldo NatalMalinda, Paul F, MD 04/07/17 [phone]

## 2017-04-07 NOTE — Discharge Instructions (Signed)
Please follow-up with [facility] or psychiatrist of your choice. Please return here for any further problems
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
## 2017-04-08 LAB — POCT PREGNANCY, URINE: Preg Test, Ur: NEGATIVE

## 2017-04-09 ENCOUNTER — Ambulatory Visit (INDEPENDENT_AMBULATORY_CARE_PROVIDER_SITE_OTHER): Payer: Managed Care, Other (non HMO) | Admitting: Physician Assistant

## 2017-04-09 ENCOUNTER — Encounter: Payer: Self-pay | Admitting: Physician Assistant

## 2017-04-09 VITALS — BP 104/68 | HR 84 | Temp 98.6°F | Resp 16 | Wt 120.0 lb

## 2017-04-09 DIAGNOSIS — F329 Major depressive disorder, single episode, unspecified: Secondary | ICD-10-CM | POA: Diagnosis not present

## 2017-04-09 DIAGNOSIS — F419 Anxiety disorder, unspecified: Secondary | ICD-10-CM

## 2017-04-09 DIAGNOSIS — F32A Depression, unspecified: Secondary | ICD-10-CM

## 2017-04-09 NOTE — Patient Instructions (Signed)
Depression Screening Depression screening is a tool that your health care provider can use to learn if you have symptoms of depression. Depression is a common condition with many symptoms that are also often found in other conditions. Depression is treatable, but it must first be diagnosed. You may not know that certain feelings, thoughts, and behaviors that you are having can be symptoms of depression. Taking a depression screening test can help you and your health care provider decide if you need more assessment, or if you should be referred to a mental health care provider. What are the screening tests?  You may have a physical exam to see if another condition is affecting your mental health. You may have a blood or urine sample taken during the physical exam.  You may be interviewed using a screening tool that was developed from research, such as one of these: ? Patient Health Questionnaire (PHQ). This is a set of either 2 or 9 questions. A health care provider who has been trained to score this screening test uses a guide to assess if your symptoms suggest that you may have depression. ? Hamilton Depression Rating Scale (HAM-D). This is a set of either 17 or 24 questions. You may be asked to take it again during or after your treatment, to see if your depression has gotten better. ? Beck Depression Inventory (BDI). This is a set of 21 multiple choice questions. Your health care provider scores your answers to assess:  Your level of depression, ranging from mild to severe.  Your response to treatment.  Your health care provider may talk with you about your daily activities, such as eating, sleeping, work, and recreation, and ask if you have had any changes in activity.  Your health care provider may ask you to see a mental health specialist, such as a psychiatrist or psychologist, for more evaluation. Who should be screened for depression?  All adults, including adults with a family history  of a mental health disorder.  Adolescents who are 12-18 years old.  People who are recovering from a myocardial infarction (MI).  Pregnant women, or women who have given birth.  People who have a long-term (chronic) illness.  Anyone who has been diagnosed with another type of a mental health disorder.  Anyone who has symptoms that could show depression. What do my results mean? Your health care provider will review the results of your depression screening, physical exam, and lab tests. Positive screens suggest that you may have depression. Screening is the first step in getting the care that you may need. It is up to you to get your screening results. Ask your health care provider, or the department that is doing your screening tests, when your results will be ready. Talk with your health care provider about your results and diagnosis. A diagnosis of depression is made using the Diagnostic and Statistical Manual of Mental Disorders (DSM-V). This is a book that lists the number and type of symptoms that must be present for a health care provider to give a specific diagnosis.  Your health care provider may work with you to treat your symptoms of depression, or your health care provider may help you find a mental health provider who can assess, diagnose, and treat your depression. Get help right away if:  You have thoughts about hurting yourself or others. If you ever feel like you may hurt yourself or others, or have thoughts about taking your own life, get help right away. You can   go to your nearest emergency department or call:  Your local emergency services (911 in the [place]  A suicide crisis helpline, such as the National Suicide Prevention Lifeline at 1-800-273-8255. This is open 24 hours a day.  Summary  Depression screening is the first step in getting the help that you may need.  If your screening test shows symptoms of depression (is positive), your health care provider may ask  you to see a mental health provider.  Anyone who is age 12 or older should be screened for depression. This information is not intended to replace advice given to you by your health care provider. Make sure you discuss any questions you have with your health care provider. Document Released: 08/01/2016 Document Revised: 08/01/2016 Document Reviewed: 08/01/2016 Elsevier Interactive Patient Education  2018 [employer]  
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
## 2017-04-21 ENCOUNTER — Telehealth: Payer: Self-pay | Admitting: Physician Assistant

## 2017-04-21 NOTE — Telephone Encounter (Signed)
Patient is calling to see if her FMLA paperwork as been faxed to her provider.   She said it was suppose to be faxed last week.

## 2017-04-21 NOTE — Telephone Encounter (Signed)
Pt called back to get an update of the status of her FMLA forms that per patient were given to Adriana at her OV on 04/09/17 and that Ricki Rodriguezdriana told her they would be ready by the following Monday 04/13/17. I placed pt on hold and spoke with Vernona RiegerLaura. Vernona RiegerLaura advised they had been faxed. I advised pt the forms were faxed. Pt stated that her place of employment hadn't received them. Pt was advised that we would get them from Medical Records and re-fax the forms. Pt requested that I let them know how frustrated this is making her and that if she loses her job it's on us. I apologized to pt. Pt requested a call back to let her know when they have been re-faxed. Thanks TNP

## 2017-04-22 NOTE — Telephone Encounter (Signed)
Form refaxed 04/21/2017 (Trasnmission Log says the fax completed successfully)  Advised pt and told her to call if she has anymore issues.   Thanks,   -Vernona RiegerLaura

## 2017-04-28 ENCOUNTER — Telehealth: Payer: Self-pay | Admitting: Physician Assistant
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
## 2017-04-28 NOTE — Telephone Encounter (Signed)
She will need to schedule appointment to document symptoms, resolution, and provide note as we had discussed very particular dates.

## 2017-04-29 ENCOUNTER — Encounter: Payer: Self-pay | Admitting: Physician Assistant

## 2017-04-29 ENCOUNTER — Ambulatory Visit: Payer: Managed Care, Other (non HMO) | Admitting: Physician Assistant

## 2017-04-29 VITALS — BP 104/68 | HR 84 | Temp 98.7°F | Resp 16 | Wt 121.0 lb

## 2017-04-29 DIAGNOSIS — T7491XS Unspecified adult maltreatment, confirmed, sequela: Secondary | ICD-10-CM

## 2017-04-29 DIAGNOSIS — F419 Anxiety disorder, unspecified: Secondary | ICD-10-CM

## 2017-04-29 DIAGNOSIS — F331 Major depressive disorder, recurrent, moderate: Secondary | ICD-10-CM | POA: Diagnosis not present

## 2017-04-29 MED ORDER — HYDROXYZINE PAMOATE 25 MG PO CAPS: 25.0000 mg | ORAL_CAPSULE | Freq: Every day | ORAL | 0 refills | Status: DC | PRN

## 2017-04-29 NOTE — Patient Instructions (Addendum)
[address], [place], [place] [id number] - [facility]    Depression Screening Depression screening is a tool that your health care provider can use to learn if you have symptoms of depression. Depression is a common condition with many symptoms that are also often found in other conditions. Depression is treatable, but it must first be diagnosed. You may not know that certain feelings, thoughts, and behaviors that you are having can be symptoms of depression. Taking a depression screening test can help you and your health care provider decide if you need more assessment, or if you should be referred to a mental health care provider. What are the screening tests?  You may have a physical exam to see if another condition is affecting your mental health. You may have a blood or urine sample taken during the physical exam.  You may be interviewed using a screening tool that was developed from research, such as one of these: ? Patient Health Questionnaire (PHQ). This is a set of either 2 or 9 questions. A health care provider who has been trained to score this screening test uses a guide to assess if your symptoms suggest that you may have depression. ? Hamilton Depression Rating Scale (HAM-D). This is a set of either 17 or 24 questions. You may be asked to take it again during or after your treatment, to see if your depression has gotten better. ? Beck Depression Inventory (BDI). This is a set of 21 multiple choice questions. Your health care provider scores your answers to assess:  Your level of depression, ranging from mild to severe.  Your response to treatment.  Your health care provider may talk with you about your daily activities, such as eating, sleeping, work, and recreation, and ask if you have had any changes in activity.  Your health care provider may ask you to see a mental health specialist, such as a psychiatrist or psychologist, for more evaluation. Who should be screened  for depression?  All adults, including adults with a family history of a mental health disorder.  Adolescents who are 4512-28 years old.  People who are recovering from a myocardial infarction (MI).  Pregnant women, or women who have given birth.  People who have a long-term (chronic) illness.  Anyone who has been diagnosed with another type of a mental health disorder.  Anyone who has symptoms that could show depression. What do my results mean? Your health care provider will review the results of your depression screening, physical exam, and lab tests. Positive screens suggest that you may have depression. Screening is the first step in getting the care that you may need. It is up to you to get your screening results. Ask your health care provider, or the department that is doing your screening tests, when your results will be ready. Talk with your health care provider about your results and diagnosis. A diagnosis of depression is made using the Diagnostic and Statistical Manual of Mental Disorders (DSM-V). This is a book that lists the number and type of symptoms that must be present for a health care provider to give a specific diagnosis.  Your health care provider may work with you to treat your symptoms of depression, or your health care provider may help you find a mental health provider who can assess, diagnose, and treat your depression. Get help right away if:  You have thoughts about hurting yourself or others. If you ever feel like you may hurt yourself or others,  or have thoughts about taking your own life, get help right away. You can go to your nearest emergency department or call:  Your local emergency services (911 in the [place]  A suicide crisis helpline, such as the National Suicide Prevention Lifeline at 604-245-6195. This is open 24 hours a day.  Summary  Depression screening is the first step in getting the help that you may need.  If your screening test shows  symptoms of depression (is positive), your health care provider may ask you to see a mental health provider.  Anyone who is age 16 or older should be screened for depression. This information is not intended to replace advice given to you by your health care provider. Make sure you discuss any questions you have with your health care provider. Document Released: 08/01/2016 Document Revised: 08/01/2016 Document Reviewed: 08/01/2016 Elsevier Interactive Patient Education  2018 [employer].

## 2017-04-29 NOTE — Telephone Encounter (Signed)
Pt advised. Apt made for 11:30 today.   Thanks,   -Vernona RiegerLaura
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
## 2017-05-01 ENCOUNTER — Ambulatory Visit: Payer: Managed Care, Other (non HMO) | Admitting: Psychiatry

## 2017-05-01 ENCOUNTER — Encounter: Payer: Self-pay | Admitting: Psychiatry

## 2017-05-01 ENCOUNTER — Other Ambulatory Visit: Payer: Self-pay

## 2017-05-01 VITALS — BP 105/71 | HR 91 | Temp 98.5°F | Wt 120.6 lb

## 2017-05-01 DIAGNOSIS — F331 Major depressive disorder, recurrent, moderate: Secondary | ICD-10-CM | POA: Diagnosis not present

## 2017-05-01 DIAGNOSIS — F411 Generalized anxiety disorder: Secondary | ICD-10-CM

## 2017-05-01 MED ORDER — CITALOPRAM HYDROBROMIDE 10 MG PO TABS: 5.0000 mg | ORAL_TABLET | Freq: Every day | ORAL | 1 refills | Status: DC

## 2017-05-01 MED ORDER — TRAZODONE HCL 50 MG PO TABS: 25.0000 mg | ORAL_TABLET | Freq: Every evening | ORAL | 1 refills | Status: DC | PRN

## 2017-05-01 NOTE — Patient Instructions (Signed)
Trazodone tablets What is this medicine? TRAZODONE (TRAZ oh done) is used to treat depression. This medicine may be used for other purposes; ask your health care provider or pharmacist if you have questions. COMMON BRAND NAME(S): Desyrel What should I tell my health care provider before I take this medicine? They need to know if you have any of these conditions: -attempted suicide or thinking about it -bipolar disorder -bleeding problems -glaucoma -heart disease, or previous heart attack -irregular heart beat -kidney or liver disease -low levels of sodium in the blood -an unusual or allergic reaction to trazodone, other medicines, foods, dyes or preservatives -pregnant or trying to get pregnant -breast-feeding How should I use this medicine? Take this medicine by mouth with a glass of water. Follow the directions on the prescription label. Take this medicine shortly after a meal or a light snack. Take your medicine at regular intervals. Do not take your medicine more often than directed. Do not stop taking this medicine suddenly except upon the advice of your doctor. Stopping this medicine too quickly may cause serious side effects or your condition may worsen. A special MedGuide will be given to you by the pharmacist with each prescription and refill. Be sure to read this information carefully each time. Talk to your pediatrician regarding the use of this medicine in children. Special care may be needed. Overdosage: If you think you have taken too much of this medicine contact a poison control center or emergency room at once. NOTE: This medicine is only for you. Do not share this medicine with others. What if I miss a dose? If you miss a dose, take it as soon as you can. If it is almost time for your next dose, take only that dose. Do not take double or extra doses. What may interact with this medicine? Do not take this medicine with any of the following medications: -certain medicines  for fungal infections like fluconazole, itraconazole, ketoconazole, posaconazole, voriconazole -cisapride -dofetilide -dronedarone -linezolid -MAOIs like Carbex, Eldepryl, Marplan, Nardil, and Parnate -mesoridazine -methylene blue (injected into a vein) -pimozide -saquinavir -thioridazine -ziprasidone This medicine may also interact with the following medications: -alcohol -antiviral medicines for HIV or AIDS -aspirin and aspirin-like medicines -barbiturates like phenobarbital -certain medicines for blood pressure, heart disease, irregular heart beat -certain medicines for depression, anxiety, or psychotic disturbances -certain medicines for migraine headache like almotriptan, eletriptan, frovatriptan, naratriptan, rizatriptan, sumatriptan, zolmitriptan -certain medicines for seizures like carbamazepine and phenytoin -certain medicines for sleep -certain medicines that treat or prevent blood clots like dalteparin, enoxaparin, warfarin -digoxin -fentanyl -lithium -NSAIDS, medicines for pain and inflammation, like ibuprofen or naproxen -other medicines that prolong the QT interval (cause an abnormal heart rhythm) -rasagiline -supplements like St. John's wort, kava kava, valerian -tramadol -tryptophan This list may not describe all possible interactions. Give your health care provider a list of all the medicines, herbs, non-prescription drugs, or dietary supplements you use. Also tell them if you smoke, drink alcohol, or use illegal drugs. Some items may interact with your medicine. What should I watch for while using this medicine? Tell your doctor if your symptoms do not get better or if they get worse. Visit your doctor or health care professional for regular checks on your progress. Because it may take several weeks to see the full effects of this medicine, it is important to continue your treatment as prescribed by your doctor. Patients and their families should watch out for new  or worsening thoughts of suicide or depression. Also   watch out for sudden changes in feelings such as feeling anxious, agitated, panicky, irritable, hostile, aggressive, impulsive, severely restless, overly excited and hyperactive, or not being able to sleep. If this happens, especially at the beginning of treatment or after a change in dose, call your health care professional. You may get drowsy or dizzy. Do not drive, use machinery, or do anything that needs mental alertness until you know how this medicine affects you. Do not stand or sit up quickly, especially if you are an older patient. This reduces the risk of dizzy or fainting spells. Alcohol may interfere with the effect of this medicine. Avoid alcoholic drinks. This medicine may cause dry eyes and blurred vision. If you wear contact lenses you may feel some discomfort. Lubricating drops may help. See your eye doctor if the problem does not go away or is severe. Your mouth may get dry. Chewing sugarless gum, sucking hard candy and drinking plenty of water may help. Contact your doctor if the problem does not go away or is severe. What side effects may I notice from receiving this medicine? Side effects that you should report to your doctor or health care professional as soon as possible: -allergic reactions like skin rash, itching or hives, swelling of the face, lips, or tongue -elevated mood, decreased need for sleep, racing thoughts, impulsive behavior -confusion -fast, irregular heartbeat -feeling faint or lightheaded, falls -feeling agitated, angry, or irritable -loss of balance or coordination -painful or prolonged erections -restlessness, pacing, inability to keep still -suicidal thoughts or other mood changes -tremors -trouble sleeping -seizures -unusual bleeding or bruising Side effects that usually do not require medical attention (report to your doctor or health care professional if they continue or are bothersome): -change in  sex drive or performance -change in appetite or weight -constipation -headache -muscle aches or pains -nausea This list may not describe all possible side effects. Call your doctor for medical advice about side effects. You may report side effects to FDA at 1-800-FDA-1088. Where should I keep my medicine? Keep out of the reach of children. Store at room temperature between 15 and 30 degrees C (59 to 86 degrees F). Protect from light. Keep container tightly closed. Throw away any unused medicine after the expiration date. NOTE: This sheet is a summary. It may not cover all possible information. If you have questions about this medicine, talk to your doctor, pharmacist, or health care provider.  2018 [employer] (2015-08-16 16:57:05) Citalopram tablets What is this medicine? CITALOPRAM (sye TAL oh pram) is a medicine for depression. This medicine may be used for other purposes; ask your health care provider or pharmacist if you have questions. COMMON BRAND NAME(S): Celexa What should I tell my health care provider before I take this medicine? They need to know if you have any of these conditions: -bleeding disorders -bipolar disorder or a family history of bipolar disorder -glaucoma -heart disease -history of irregular heartbeat -kidney disease -liver disease -low levels of magnesium or potassium in the blood -receiving electroconvulsive therapy -seizures -suicidal thoughts, plans, or attempt; a previous suicide attempt by you or a family member -take medicines that treat or prevent blood clots -thyroid disease -an unusual or allergic reaction to citalopram, escitalopram, other medicines, foods, dyes, or preservatives -pregnant or trying to become pregnant -breast-feeding How should I use this medicine? Take this medicine by mouth with a glass of water. Follow the directions on the prescription label. You can take it with or without food. Take your medicine at   regular  intervals. Do not take your medicine more often than directed. Do not stop taking this medicine suddenly except upon the advice of your doctor. Stopping this medicine too quickly may cause serious side effects or your condition may worsen. A special MedGuide will be given to you by the pharmacist with each prescription and refill. Be sure to read this information carefully each time. Talk to your pediatrician regarding the use of this medicine in children. Special care may be needed. Patients over 60 years old may have a stronger reaction and need a smaller dose. Overdosage: If you think you have taken too much of this medicine contact a poison control center or emergency room at once. NOTE: This medicine is only for you. Do not share this medicine with others. What if I miss a dose? If you miss a dose, take it as soon as you can. If it is almost time for your next dose, take only that dose. Do not take double or extra doses. What may interact with this medicine? Do not take this medicine with any of the following medications: -certain medicines for fungal infections like fluconazole, itraconazole, ketoconazole, posaconazole, voriconazole -cisapride -dofetilide -dronedarone -escitalopram -linezolid -MAOIs like Carbex, Eldepryl, Marplan, Nardil, and Parnate -methylene blue (injected into a vein) -pimozide -thioridazine -ziprasidone This medicine may also interact with the following medications: -alcohol -amphetamines -aspirin and aspirin-like medicines -carbamazepine -certain medicines for depression, anxiety, or psychotic disturbances -certain medicines for infections like chloroquine, clarithromycin, erythromycin, furazolidone, isoniazid, pentamidine -certain medicines for migraine headaches like almotriptan, eletriptan, frovatriptan, naratriptan, rizatriptan, sumatriptan, zolmitriptan -certain medicines for sleep -certain medicines that treat or prevent blood clots like dalteparin,  enoxaparin, warfarin -cimetidine -diuretics -fentanyl -lithium -methadone -metoprolol -NSAIDs, medicines for pain and inflammation, like ibuprofen or naproxen -omeprazole -other medicines that prolong the QT interval (cause an abnormal heart rhythm) -procarbazine -rasagiline -supplements like St. John's wort, kava kava, valerian -tramadol -tryptophan This list may not describe all possible interactions. Give your health care provider a list of all the medicines, herbs, non-prescription drugs, or dietary supplements you use. Also tell them if you smoke, drink alcohol, or use illegal drugs. Some items may interact with your medicine. What should I watch for while using this medicine? Tell your doctor if your symptoms do not get better or if they get worse. Visit your doctor or health care professional for regular checks on your progress. Because it may take several weeks to see the full effects of this medicine, it is important to continue your treatment as prescribed by your doctor. Patients and their families should watch out for new or worsening thoughts of suicide or depression. Also watch out for sudden changes in feelings such as feeling anxious, agitated, panicky, irritable, hostile, aggressive, impulsive, severely restless, overly excited and hyperactive, or not being able to sleep. If this happens, especially at the beginning of treatment or after a change in dose, call your health care professional. You may get drowsy or dizzy. Do not drive, use machinery, or do anything that needs mental alertness until you know how this medicine affects you. Do not stand or sit up quickly, especially if you are an older patient. This reduces the risk of dizzy or fainting spells. Alcohol may interfere with the effect of this medicine. Avoid alcoholic drinks. Your mouth may get dry. Chewing sugarless gum or sucking hard candy, and drinking plenty of water will help. Contact your doctor if the problem does  not go away or is severe. What side effects   may I notice from receiving this medicine? Side effects that you should report to your doctor or health care professional as soon as possible: -allergic reactions like skin rash, itching or hives, swelling of the face, lips, or tongue -anxious -black, tarry stools -breathing problems -changes in vision -chest pain -confusion -elevated mood, decreased need for sleep, racing thoughts, impulsive behavior -eye pain -fast, irregular heartbeat -feeling faint or lightheaded, falls -feeling agitated, angry, or irritable -hallucination, loss of contact with reality -loss of balance or coordination -loss of memory -painful or prolonged erections -restlessness, pacing, inability to keep still -seizures -stiff muscles -suicidal thoughts or other mood changes -trouble sleeping -unusual bleeding or bruising -unusually weak or tired -vomiting Side effects that usually do not require medical attention (report to your doctor or health care professional if they continue or are bothersome): -change in appetite or weight -change in sex drive or performance -dizziness -headache -increased sweating -indigestion, nausea -tremors This list may not describe all possible side effects. Call your doctor for medical advice about side effects. You may report side effects to FDA at 1-800-FDA-1088. Where should I keep my medicine? Keep out of reach of children. Store at room temperature between 15 and 30 degrees C (59 and 86 degrees F). Throw away any unused medicine after the expiration date. NOTE: This sheet is a summary. It may not cover all possible information. If you have questions about this medicine, talk to your doctor, pharmacist, or health care provider.  2018 [employer] Standard (2015-08-20 13:18:52)  
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
## 2017-05-20 ENCOUNTER — Ambulatory Visit: Payer: Managed Care, Other (non HMO) | Admitting: Licensed Clinical Social Worker

## 2017-05-20 DIAGNOSIS — F331 Major depressive disorder, recurrent, moderate: Secondary | ICD-10-CM

## 2017-05-20 DIAGNOSIS — F411 Generalized anxiety disorder: Secondary | ICD-10-CM | POA: Diagnosis not present
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
## 2017-05-22 ENCOUNTER — Ambulatory Visit: Payer: Managed Care, Other (non HMO) | Admitting: Psychiatry

## 2017-05-22 ENCOUNTER — Emergency Department
Admission: EM | Admit: 2017-05-22 | Discharge: 2017-05-22 | Disposition: A | Discharge status: Home / Self Care | Payer: 59 | Attending: Emergency Medicine | Admitting: Emergency Medicine

## 2017-05-22 ENCOUNTER — Encounter: Payer: Self-pay | Admitting: Psychiatry

## 2017-05-22 ENCOUNTER — Other Ambulatory Visit: Payer: Self-pay

## 2017-05-22 VITALS — BP 112/74 | HR 69 | Temp 98.2°F | Wt 124.6 lb

## 2017-05-22 DIAGNOSIS — F329 Major depressive disorder, single episode, unspecified: Secondary | ICD-10-CM | POA: Diagnosis present

## 2017-05-22 DIAGNOSIS — F419 Anxiety disorder, unspecified: Secondary | ICD-10-CM

## 2017-05-22 DIAGNOSIS — F331 Major depressive disorder, recurrent, moderate: Secondary | ICD-10-CM

## 2017-05-22 DIAGNOSIS — F411 Generalized anxiety disorder: Secondary | ICD-10-CM

## 2017-05-22 DIAGNOSIS — F1210 Cannabis abuse, uncomplicated: Secondary | ICD-10-CM

## 2017-05-22 DIAGNOSIS — F32A Depression, unspecified: Secondary | ICD-10-CM

## 2017-05-22 DIAGNOSIS — F4310 Post-traumatic stress disorder, unspecified: Secondary | ICD-10-CM | POA: Diagnosis present

## 2017-05-22 LAB — COMPREHENSIVE METABOLIC PANEL
ALBUMIN: 5 g/dL (ref 3.5–5.0)
ALK PHOS: 58 U/L (ref 38–126)
ALT: 22 U/L (ref 14–54)
AST: 24 U/L (ref 15–41)
Anion gap: 9 (ref 5–15)
BILIRUBIN TOTAL: 1 mg/dL (ref 0.3–1.2)
BUN: 11 mg/dL (ref 6–20)
CALCIUM: 9.4 mg/dL (ref 8.9–10.3)
CO2: 23 mmol/L (ref 22–32)
Chloride: 105 mmol/L (ref 101–111)
Creatinine, Ser: 0.62 mg/dL (ref 0.44–1.00)
GFR calc Af Amer: >60 mL/min (ref >60)
GFR calc non Af Amer: >60 mL/min (ref >60)
Glucose, Bld: 96 mg/dL (ref 65–99)
POTASSIUM: 3.3 mmol/L — AB (ref 3.5–5.1)
Sodium: 137 mmol/L (ref 135–145)
Total Protein: 9.4 g/dL — ABNORMAL HIGH (ref 6.5–8.1)

## 2017-05-22 LAB — URINE DRUG SCREEN, QUALITATIVE (ARMC ONLY)
AMPHETAMINES, UR SCREEN: NOT DETECTED
Barbiturates, Ur Screen: NOT DETECTED
Benzodiazepine, Ur Scrn: NOT DETECTED
COCAINE METABOLITE, UR ~~LOC~~: NOT DETECTED
Cannabinoid 50 Ng, Ur ~~LOC~~: POSITIVE — AB
MDMA (Ecstasy)Ur Screen: NOT DETECTED
Methadone Scn, Ur: NOT DETECTED
OPIATE, UR SCREEN: NOT DETECTED
PHENCYCLIDINE (PCP) UR S: NOT DETECTED
Tricyclic, Ur Screen: NOT DETECTED

## 2017-05-22 LAB — CBC
HEMATOCRIT: 43.9 % (ref 35.0–47.0)
HEMOGLOBIN: 14.5 g/dL (ref 12.0–16.0)
MCH: 28.7 pg (ref 26.0–34.0)
MCHC: 33.2 g/dL (ref 32.0–36.0)
MCV: 86.5 fL (ref 80.0–100.0)
Platelets: 238 10*3/uL (ref 150–440)
RBC: 5.07 MIL/uL (ref 3.80–5.20)
RDW: 12.7 % (ref 11.5–14.5)
WBC: 7.2 10*3/uL (ref 3.6–11.0)

## 2017-05-22 LAB — SALICYLATE LEVEL: Salicylate Lvl: <7 mg/dL (ref 2.8–30.0)

## 2017-05-22 LAB — POCT PREGNANCY, URINE: Preg Test, Ur: NEGATIVE

## 2017-05-22 LAB — ACETAMINOPHEN LEVEL: Acetaminophen (Tylenol), Serum: <10 ug/mL — ABNORMAL LOW (ref 10–30)

## 2017-05-22 LAB — ETHANOL: Alcohol, Ethyl (B): <10 mg/dL (ref <10)

## 2017-05-22 MED ORDER — CITALOPRAM HYDROBROMIDE 10 MG PO TABS: 10.0000 mg | ORAL_TABLET | Freq: Every day | ORAL | 1 refills | Status: DC

## 2017-05-22 NOTE — ED Notes (Signed)
Pt in interview room with TTS.  
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
## 2017-05-22 NOTE — Patient Instructions (Signed)
Cannabis Use Disorder Cannabis use disorder is when using marijuana disrupts a person's daily life or causes health problems. This condition can be dangerous. The health problems this condition can cause include:  Long-lasting problems with thinking and learning. These can be permanent in young people.  Severe anxiety.  Paranoia.  Hallucinations.  Dangerously high blood pressure and heart rate.  Schizophrenia.  Breathing problems.  Problems with child development during and after pregnancy.  People with this condition are also more likely to use other drugs. What are the causes? This condition is caused by using marijuana too much over time. It is not caused by using it only once in a while. Many people with this condition use marijuana because it gives them a feeling of extreme pleasure or relaxation. What increases the risk? This condition is more likely to develop in:  Men.  People with a family history of cannabis use disorder.  People with mental health issues such as depression or post-traumatic stress disorder.  What are the signs or symptoms? Symptoms of this condition include:  Using greater amounts of marijuana than you want to, or using marijuana for longer than you want to.  Craving marijuana.  Spending a lot of time getting marijuana and using it or recovering from its effects.  Having problems at work, at school, at home, or in relationships because of marijuana use.  Giving up or cutting down on important life activities because of marijuana use.  Using marijuana at times when it is dangerous, such as while you are driving a car.  Needing more and more marijuana to get the same effect you want from the marijuana (building up a tolerance).  Physical problems, such as: ? A long-lasting cough. ? Bronchitis. ? Emphysema. ? Throat and lung cancer.  Mental problems, such as: ? Psychosis. ? Anxiety. ? Trouble sleeping.  Having symptoms of withdrawal  when you stop using marijuana. Symptom of withdrawal include: ? Irritability or anger. ? Anxiety or restlessness. ? Trouble sleeping. ? Loss of appetite or weight loss. ? Aches and pains. ? Shakiness, sweating, or chills.  How is this diagnosed? This condition is diagnosed with an assessment. During the assessment, your health care provider will ask about your marijuana use and about how it affects your life. You will be diagnosed with the condition if you have had at least two symptoms of this condition within a 12-month period. How severe the condition is depends on how many symptoms you have.  If you have two to three symptoms, your condition is mild.  If you have four to five symptoms, your condition is moderate.  If you have six or more symptoms, your condition is severe.  Your health care provider may perform a physical exam or do lab tests to see if you have physical problems resulting from marijuana use. Your health care provider may also screen for drug use and refer you to a mental health professional for evaluation. How is this treated? Treatment for this condition is usually provided by mental health professionals with training in substance use disorders. Your treatment may involve:  Counseling. This treatment is also called talk therapy. It is provided by substance use treatment counselors. A counselor can address the reasons you use marijuana and suggest ways to keep you from using it again. The goals of talk therapy are to: ? Find healthy activities to replace using marijuana. ? Identify and avoid the things that trigger your marijuana use. ? Help you learn how to handle   cravings.  Support groups. Support groups are led by people who have quit using marijuana. They provide emotional support, advice, and guidance.  Medicine. Medicine is used to treat mental health issues that trigger marijuana use or that result from it.  Follow these instructions at home:  Take  over-the-counter and prescription medicines only as told by your health care provider.  Check with your health care provider before starting any new medicines.  Keep all follow-up visits as told by your health care provider. This is important.  Work with your counselor or group to develop tools to keep you from using marijuana again (relapsing).  Make healthy lifestyle choices, such as: ? Eating a healthy diet. ? Getting enough exercise. ? Improving your stress-management skills.  Learn daily living skills and work skills. Where to find more information:  National Institute on Drug Abuse: www.drugabuse.gov  Substance Abuse and Mental Health Services Administration: www.samhsa.gov Contact a health care provider if:  You are not able to take your medicines as told.  Your symptoms get worse. Get help right away if:  You have serious thoughts about hurting yourself or others. If you ever feel like you may hurt yourself or others, or have thoughts about taking your own life, get help right away. You can go to your nearest emergency department or call:  Your local emergency services (911 in the [place]  A suicide crisis helpline, such as the National Suicide Prevention Lifeline at 1-800-273-8255. This is open 24 hours a day.  This information is not intended to replace advice given to you by your health care provider. Make sure you discuss any questions you have with your health care provider. Document Released: 03/14/2000 Document Revised: 12/14/2015 Document Reviewed: 12/14/2015 Elsevier Interactive Patient Education  2018 Elsevier Inc.  
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
## 2017-05-22 NOTE — ED Notes (Signed)
[facility] called for pt 

## 2017-05-22 NOTE — ED Notes (Signed)
Pt returned to hallway chair after consultation room visit
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
## 2017-05-23 NOTE — BH Assessment (Signed)
Provided pt resources for women's shelters in [place], [place], and [place], so she would have them if she were to decide that she wants to leave her husband.   05/22/2017 1951

## 2017-05-29 ENCOUNTER — Ambulatory Visit: Payer: Managed Care, Other (non HMO) | Admitting: Licensed Clinical Social Worker

## 2017-05-29 DIAGNOSIS — F331 Major depressive disorder, recurrent, moderate: Secondary | ICD-10-CM | POA: Diagnosis not present
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
## 2017-06-02 ENCOUNTER — Ambulatory Visit (INDEPENDENT_AMBULATORY_CARE_PROVIDER_SITE_OTHER): Payer: Managed Care, Other (non HMO) | Admitting: Physician Assistant

## 2017-06-02 ENCOUNTER — Encounter: Payer: Self-pay | Admitting: Physician Assistant

## 2017-06-02 VITALS — BP 104/68 | HR 68 | Temp 98.5°F | Resp 16 | Wt 121.0 lb

## 2017-06-02 DIAGNOSIS — F32A Depression, unspecified: Secondary | ICD-10-CM

## 2017-06-02 DIAGNOSIS — F419 Anxiety disorder, unspecified: Secondary | ICD-10-CM

## 2017-06-02 DIAGNOSIS — F329 Major depressive disorder, single episode, unspecified: Secondary | ICD-10-CM

## 2017-06-02 NOTE — Patient Instructions (Signed)

## 2017-06-02 NOTE — Progress Notes (Signed)
Patient: Connie Guzman Female    DOB: 04-Aug-1990   27 y.o.   MRN: [id number] Visit Date: 06/02/2017  Today's Provider: Trey SailorsAdriana M Pollak, PA-C   Chief Complaint  Patient presents with  . Anxiety  . Depression   Subjective:    Connie Guzman is a 27 y/o woman with history of anxiety and depression presenting today for follow up of the same. She has been seeing Dr. Elna BreslowEappen in psychiatry and Nolon Rodicole Peacock in counseling. Most recently, her celexa has been increased from 5 mg daily to 10 mg daily. She recently had ER visit for suicidal ideation but was not admitted. She reports compliance with her medication. She is feeling a benefit from therapy and has her next appointment for counseling 06/05/2017. She reports at the end of her lease, she will be separating from her husband and plans on moving into a different location. She reports that he has been contributing to much of her difficulties. She also regained access to a car which will now be in her name. She has plans to attend nursing school at [employer] in [place], [place].  Depression         This is a chronic problem.  Associated symptoms include decreased concentration (Pt says this has improved some) and insomnia.  Associated symptoms include no headaches and no suicidal ideas.  Past medical history includes anxiety.   Anxiety  Presents for follow-up visit. Symptoms include decreased concentration (Pt says this has improved some), depressed mood, excessive worry, insomnia and nervous/anxious behavior. Patient reports no confusion, dizziness, panic (Pt reports it has been a week since her last panic attack) or suicidal ideas. The quality of sleep is fair (Pt reports this has improved some). Nighttime awakenings: occasional.         Allergies  Allergen Reactions  . Chocolate Anaphylaxis, Hives and Other (See Comments)    Reaction:  Migraines and dizziness      Current Outpatient Medications:  .  acetaminophen (TYLENOL)  500 MG tablet, Take 1,000 mg by mouth every 6 (six) hours as needed for mild pain or headache., Disp: , Rfl:  .  citalopram (CELEXA) 10 MG tablet, Take 1 tablet (10 mg total) by mouth daily., Disp: 30 tablet, Rfl: 1 .  hydrOXYzine (VISTARIL) 25 MG capsule, Take 1 capsule (25 mg total) by mouth daily as needed., Disp: 30 capsule, Rfl: 0 .  ibuprofen (ADVIL,MOTRIN) 800 MG tablet, Take 1 tablet (800 mg total) every 8 (eight) hours as needed by mouth., Disp: 60 tablet, Rfl: 1 .  ondansetron (ZOFRAN-ODT) 4 MG disintegrating tablet, Take 1 tablet (4 mg total) by mouth every 8 (eight) hours as needed for nausea or vomiting., Disp: 20 tablet, Rfl: 0 .  traZODone (DESYREL) 50 MG tablet, Take 0.5-1 tablets (25-50 mg total) by mouth at bedtime as needed for sleep., Disp: 30 tablet, Rfl: 1  Review of Systems  Constitutional: Negative.   Respiratory: Negative.   Cardiovascular: Negative.   Gastrointestinal: Negative.   Neurological: Negative for dizziness, light-headedness and headaches.  Psychiatric/Behavioral: Positive for decreased concentration (Pt says this has improved some), depression and sleep disturbance. Negative for agitation, behavioral problems, confusion, dysphoric mood, hallucinations, self-injury and suicidal ideas. The patient is nervous/anxious and has insomnia. The patient is not hyperactive.     Social History   Tobacco Use  . Smoking status: Never Smoker  . Smokeless tobacco: Never Used  Substance Use Topics  . Alcohol use: Yes  Alcohol/week: 0.0 - 0.6 oz    Comment: Rarely   Objective:   BP 104/68 (BP Location: Right Arm, Patient Position: Sitting, Cuff Size: Normal)   Pulse 68   Temp 98.5 F (36.9 C) (Oral)   Resp 16   Wt 121 lb (54.9 kg)   LMP 05/02/2017   BMI 18.95 kg/m  Vitals:   06/02/17 0949  BP: 104/68  Pulse: 68  Resp: 16  Temp: 98.5 F (36.9 C)  TempSrc: Oral  Weight: 121 lb (54.9 kg)     Physical Exam  Constitutional: She is oriented to person,  place, and time. She appears well-developed and well-nourished.  Cardiovascular: Normal rate and regular rhythm.  Pulmonary/Chest: Effort normal and breath sounds normal.  Abdominal: Soft. Bowel sounds are normal.  Neurological: She is alert and oriented to person, place, and time.  Skin: Skin is warm and dry.  Psychiatric: She has a normal mood and affect. Her behavior is normal.        Assessment & Plan:     1. Anxiety and depression  Improving. Continue celexa and follow up with psychiatry and counseling. Will see her at physical exam.   Return in about 5 months (around 11/02/2017) for Annual physical exam.  The entirety of the information documented in the History of Present Illness, Review of Systems and Physical Exam were personally obtained by me. Portions of this information were initially documented by Kavin Leech, CMA and reviewed by me for thoroughness and accuracy.          Trey Sailors, PA-C  [facility]

## 2017-06-05 ENCOUNTER — Ambulatory Visit (INDEPENDENT_AMBULATORY_CARE_PROVIDER_SITE_OTHER): Payer: Managed Care, Other (non HMO) | Admitting: Licensed Clinical Social Worker

## 2017-06-05 DIAGNOSIS — F331 Major depressive disorder, recurrent, moderate: Secondary | ICD-10-CM | POA: Diagnosis not present

## 2017-06-11 ENCOUNTER — Ambulatory Visit: Payer: Managed Care, Other (non HMO) | Admitting: Licensed Clinical Social Worker

## 2017-06-11 DIAGNOSIS — F411 Generalized anxiety disorder: Secondary | ICD-10-CM | POA: Diagnosis not present

## 2017-06-11 DIAGNOSIS — F331 Major depressive disorder, recurrent, moderate: Secondary | ICD-10-CM

## 2017-06-12 ENCOUNTER — Ambulatory Visit: Payer: Managed Care, Other (non HMO) | Admitting: Licensed Clinical Social Worker

## 2017-06-29 ENCOUNTER — Encounter: Payer: Self-pay | Admitting: Psychiatry

## 2017-06-29 ENCOUNTER — Other Ambulatory Visit: Payer: Self-pay

## 2017-06-29 ENCOUNTER — Ambulatory Visit: Payer: Managed Care, Other (non HMO) | Admitting: Psychiatry

## 2017-06-29 VITALS — BP 109/72 | HR 98 | Temp 98.1°F | Wt 122.2 lb

## 2017-06-29 DIAGNOSIS — F1211 Cannabis abuse, in remission: Secondary | ICD-10-CM | POA: Diagnosis not present

## 2017-06-29 DIAGNOSIS — F411 Generalized anxiety disorder: Secondary | ICD-10-CM | POA: Diagnosis not present

## 2017-06-29 DIAGNOSIS — F331 Major depressive disorder, recurrent, moderate: Secondary | ICD-10-CM | POA: Diagnosis not present

## 2017-06-29 MED ORDER — HYDROXYZINE PAMOATE 25 MG PO CAPS
25.0000 mg | ORAL_CAPSULE | Freq: Three times a day (TID) | ORAL | 1 refills | Status: DC | PRN
Start: 2017-06-29 — End: 2017-07-29
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
## 2017-06-30 ENCOUNTER — Encounter: Payer: Self-pay | Admitting: Psychiatry

## 2017-06-30 NOTE — Progress Notes (Signed)
  THERAPIST PROGRESS NOTE   Date of Service:   06/05/2017 Session Time:   1 hour  Patient:   Connie Guzman   DOB:   [birth year]  MR Number:  [id number]  Location:  [facility] [place] [facility] [address] [place] [address] Dept: [phone]            Provider/Observer:  Lubertha South Counselor  Risk of Suicide/Violence: low   Diagnosis:    MDD (major depressive disorder), recurrent episode, moderate ([place])  Type of Therapy: Individual Therapy  Treatment Goals addressed: Coping  Participation Level: Active    Behavioral Observation: [name]  presents as a 27 y.o.-year-old  Interventions: CBT and Motivational Interviewing   Behavioral Response: CasualAlertDepressed   Summary: Therapist met with Patient in an outpatient setting to assess current mood and assist with making progress towards goals through the use of therapeutic intervention. Therapist did a brief mood check, assessing anger, fear, disgust, excitement, happiness, and sadness.  Patient expressed her concerns with her relationship. She reports that she and her husband and trying to determine the fate of their relationship.  Patient reports that she cannot continue with the way the current relationship is going. SHe was able to verbally list how she wants her mate to engage with her.  Therapist assisted patient with processing pros and cons about the current relationship.  Therapist educated Patient on her depression.  Discussion of realistic and unrealistic expectations in her mate.    Plan: [name] will continue to manage symptoms   Return again in 1 weeks.
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
## 2017-07-10 ENCOUNTER — Ambulatory Visit: Payer: Managed Care, Other (non HMO) | Admitting: Psychiatry

## 2017-07-13 ENCOUNTER — Ambulatory Visit: Payer: Managed Care, Other (non HMO) | Admitting: Licensed Clinical Social Worker

## 2017-07-16 ENCOUNTER — Telehealth: Payer: Self-pay

## 2017-07-16 NOTE — Telephone Encounter (Signed)
pt came by states that she needs a letter stating that she would due better if she had a consistent schedule.  she routates now wants to see if you write a letter that she would befit with working day shift.  she needs by next thursday.  [phone]

## 2017-07-16 NOTE — Telephone Encounter (Signed)
Sharlynn OliphantJess will work on Physicist, medicalletter

## 2017-07-20 NOTE — Telephone Encounter (Signed)
letter review is on your desk , please review an make any changes you would like before signing final copy.

## 2017-07-21 NOTE — Telephone Encounter (Signed)
letter is ready for pickup tried to call pt but no message could be left voicmail box full.

## 2017-07-22 ENCOUNTER — Other Ambulatory Visit: Payer: Self-pay | Admitting: Psychiatry

## 2017-07-22 DIAGNOSIS — F411 Generalized anxiety disorder: Secondary | ICD-10-CM

## 2017-07-22 DIAGNOSIS — F331 Major depressive disorder, recurrent, moderate: Secondary | ICD-10-CM

## 2017-07-29 ENCOUNTER — Encounter: Payer: Self-pay | Admitting: Psychiatry

## 2017-07-29 ENCOUNTER — Ambulatory Visit (INDEPENDENT_AMBULATORY_CARE_PROVIDER_SITE_OTHER): Payer: Managed Care, Other (non HMO) | Admitting: Psychiatry

## 2017-07-29 ENCOUNTER — Other Ambulatory Visit: Payer: Self-pay

## 2017-07-29 VITALS — BP 109/77 | HR 82 | Temp 99.4°F | Wt 119.4 lb

## 2017-07-29 DIAGNOSIS — F1211 Cannabis abuse, in remission: Secondary | ICD-10-CM

## 2017-07-29 DIAGNOSIS — F331 Major depressive disorder, recurrent, moderate: Secondary | ICD-10-CM | POA: Diagnosis not present

## 2017-07-29 DIAGNOSIS — F411 Generalized anxiety disorder: Secondary | ICD-10-CM | POA: Diagnosis not present

## 2017-07-29 MED ORDER — CITALOPRAM HYDROBROMIDE 10 MG PO TABS
15.0000 mg | ORAL_TABLET | Freq: Every day | ORAL | 1 refills | Status: DC
Start: 2017-07-29 — End: 2017-08-19

## 2017-07-29 MED ORDER — HYDROXYZINE PAMOATE 25 MG PO CAPS: 25.0000 mg | ORAL_CAPSULE | Freq: Three times a day (TID) | ORAL | 1 refills | Status: DC | PRN

## 2017-07-29 NOTE — Patient Instructions (Signed)
Start taking celexa 15 mg daily for 14 days and then start taking 20 mg after that.
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
## 2017-07-30 ENCOUNTER — Ambulatory Visit (INDEPENDENT_AMBULATORY_CARE_PROVIDER_SITE_OTHER): Payer: Managed Care, Other (non HMO) | Admitting: Licensed Clinical Social Worker

## 2017-07-30 DIAGNOSIS — F411 Generalized anxiety disorder: Secondary | ICD-10-CM | POA: Diagnosis not present

## 2017-07-30 DIAGNOSIS — F331 Major depressive disorder, recurrent, moderate: Secondary | ICD-10-CM | POA: Diagnosis not present

## 2017-08-14 ENCOUNTER — Ambulatory Visit (INDEPENDENT_AMBULATORY_CARE_PROVIDER_SITE_OTHER): Payer: Managed Care, Other (non HMO) | Admitting: Licensed Clinical Social Worker

## 2017-08-14 DIAGNOSIS — F331 Major depressive disorder, recurrent, moderate: Secondary | ICD-10-CM | POA: Diagnosis not present

## 2017-08-17 ENCOUNTER — Ambulatory Visit: Payer: Managed Care, Other (non HMO) | Admitting: Licensed Clinical Social Worker
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
## 2017-08-19 ENCOUNTER — Other Ambulatory Visit: Payer: Self-pay

## 2017-08-19 ENCOUNTER — Encounter: Payer: Self-pay | Admitting: Psychiatry

## 2017-08-19 ENCOUNTER — Ambulatory Visit: Payer: Managed Care, Other (non HMO) | Admitting: Psychiatry

## 2017-08-19 VITALS — BP 111/72 | HR 87 | Wt 118.8 lb

## 2017-08-19 DIAGNOSIS — F1211 Cannabis abuse, in remission: Secondary | ICD-10-CM | POA: Diagnosis not present

## 2017-08-19 DIAGNOSIS — F331 Major depressive disorder, recurrent, moderate: Secondary | ICD-10-CM | POA: Diagnosis not present

## 2017-08-19 DIAGNOSIS — F411 Generalized anxiety disorder: Secondary | ICD-10-CM

## 2017-08-19 MED ORDER — HYDROXYZINE HCL 10 MG PO TABS: 10.0000 mg | ORAL_TABLET | Freq: Three times a day (TID) | ORAL | 1 refills | Status: DC | PRN

## 2017-08-19 MED ORDER — CITALOPRAM HYDROBROMIDE 20 MG PO TABS: 20.0000 mg | ORAL_TABLET | Freq: Every day | ORAL | 1 refills | Status: DC
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
## 2017-08-26 ENCOUNTER — Telehealth (HOSPITAL_COMMUNITY): Payer: Self-pay | Admitting: Psychiatry

## 2017-09-09 ENCOUNTER — Other Ambulatory Visit: Payer: Self-pay

## 2017-09-09 ENCOUNTER — Ambulatory Visit (INDEPENDENT_AMBULATORY_CARE_PROVIDER_SITE_OTHER): Payer: Managed Care, Other (non HMO) | Admitting: Psychiatry

## 2017-09-09 ENCOUNTER — Encounter: Payer: Self-pay | Admitting: Psychiatry

## 2017-09-09 VITALS — BP 109/72 | HR 75 | Temp 98.0°F | Wt 120.4 lb

## 2017-09-09 DIAGNOSIS — F1211 Cannabis abuse, in remission: Secondary | ICD-10-CM | POA: Diagnosis not present

## 2017-09-09 DIAGNOSIS — F411 Generalized anxiety disorder: Secondary | ICD-10-CM

## 2017-09-09 DIAGNOSIS — F331 Major depressive disorder, recurrent, moderate: Secondary | ICD-10-CM | POA: Diagnosis not present

## 2017-09-09 MED ORDER — CITALOPRAM HYDROBROMIDE 20 MG PO TABS: 20.0000 mg | ORAL_TABLET | Freq: Every day | ORAL | 1 refills | Status: DC

## 2017-09-09 MED ORDER — HYDROXYZINE HCL 10 MG PO TABS
10.0000 mg | ORAL_TABLET | Freq: Three times a day (TID) | ORAL | 1 refills | Status: DC | PRN
Start: 2017-09-09 — End: 2018-06-19

## 2017-09-09 MED ORDER — TRAZODONE HCL 50 MG PO TABS: 50.0000 mg | ORAL_TABLET | Freq: Every evening | ORAL | 1 refills | Status: DC | PRN

## 2017-09-09 NOTE — Progress Notes (Signed)
BH MD  OP Progress Note  09/09/2017 12:37 PM Connie Guzman  MRN:  [id number]  Chief Complaint: ' I am here for follow up.' Chief Complaint    Follow-up; Medication Refill     HPI: Connie Guzman is a 27 year old African-American female, employed, separated, lives in [place], has a history of depression, presented to the clinic today for a follow-up visit.  She today reports she had been feeding more anxious and sad the past few weeks.  She however reports this week has been better.  She reports her mood symptoms became worse because her girlfriend and roommate moved out to stay with her mother.  Patient reports she did so because her girlfriend is also going through mental health issues and wanted to get herself together.  Patient reports she did not understand this initially and thought she was breaking up with her.  Patient reports she however communicated with her and that helped her to feel better.  She reports they have been talking with each other every day.  She reports they have also planned a vacation together early August.  She looks forward to the same.  Patient reports her sleep is affected and hence she started taking trazodone a higher dose of 100 mg.  She reports she wants to stay on that dose.  Patient reports work is going well.  Patient denies any suicidality at this time.  Patient continues to cope well with her separation with her ex-husband.  She has not filed for divorce yet.  She continues to be in psychotherapy with Ms. Peacock .   Visit Diagnosis:    ICD-10-CM   1. MDD (major depressive disorder), recurrent episode, moderate (HCC) F33.1 traZODone (DESYREL) 50 MG tablet  2. 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 GAD (generalized anxiety disorder) F41.1 traZODone (DESYREL) 50 MG tablet    Past Psychiatric History: Reviewed past psychiatric history from my progress note on 06/29/2017.  Past Medical History:  Past Medical History:   Diagnosis Date  . Abnormal Pap smear of cervix   . Anemia   . Anxiety   . Chlamydia   . Depression   . Dysmenorrhea   . Heart murmur   . Inflammation of cervix     Past Surgical History:  Procedure Laterality Date  . LEEP    . TONSILLECTOMY AND ADENOIDECTOMY  2009  . tubes in ears    . WISDOM TOOTH EXTRACTION  2012    Family Psychiatric History: Reviewed family psychiatric history from my progress note on 06/29/2017.  Family History:  Family History  Problem Relation Age of Onset  . Breast cancer Mother   . Hypertension Mother   . Alcohol abuse Father   . Breast cancer Maternal Grandmother   . Diabetes Maternal Grandmother   . Hyperlipidemia Maternal Grandmother   . Hypertension Maternal Grandmother   . Other Maternal Grandmother        fibroid uterus  . Bipolar disorder Sister   . Diabetes Sister        Borderline  . Anxiety disorder Sister   . Depression Sister   . Bipolar disorder Sister   . Anxiety disorder Sister   . Depression Sister   . Alcohol abuse Maternal Uncle   . Anxiety disorder Sister     Social History: She recently got separated from her husband.  She currently stays at her apartment by herself.  She does have a girlfriend with whom she has a good relationship with.  She continues to be employed. Social History   Socioeconomic History  . Marital status: Married    Spouse name: brandon  . Number of children: 0  . Years of education: Not on file  . Highest education level: Associate degree: occupational, Scientist, product/process development, or vocational program  Occupational History    Comment: full time  Social Needs  . Financial resource strain: Not hard at all  . Food insecurity:    Worry: Never true    Inability: Never true  . Transportation needs:    Medical: Yes    Non-medical: Yes  Tobacco Use  . Smoking status: Never Smoker  . Smokeless tobacco: Never Used  Substance and Sexual Activity  . Alcohol use: Yes    Alcohol/week: 0.0 - 0.6 oz    Comment:  Rarely  . Drug use: Yes    Types: Marijuana    Comment: last used 2 days ago   . Sexual activity: Yes    Partners: Male    Birth control/protection: None  Lifestyle  . Physical activity:    Days per week: 2 days    Minutes per session: 60 min  . Stress: Very much  Relationships  . Social connections:    Talks on phone: More than three times a week    Gets together: Never    Attends religious service: Never    Active member of club or organization: No    Attends meetings of clubs or organizations: Never    Relationship status: Married  Other Topics Concern  . Not on file  Social History Narrative  . Not on file    Allergies:  Allergies  Allergen Reactions  . Chocolate Anaphylaxis, Hives and Other (See Comments)    Reaction:  Migraines and dizziness     Metabolic Disorder Labs: Lab Results  Component Value Date   HGBA1C 4.8 11/21/2016   Lab Results  Component Value Date   PROLACTIN 11.4 08/06/2012   Lab Results  Component Value Date   CHOL 109 11/21/2016   TRIG 50 11/21/2016   HDL 49 11/21/2016   LDLCALC 50 11/21/2016   Lab Results  Component Value Date   TSH 0.625 11/21/2016   TSH 0.913 12/10/2014    Therapeutic Level Labs: No results found for: LITHIUM No results found for: VALPROATE No components found for:  CBMZ  Current Medications: Current Outpatient Medications  Medication Sig Dispense Refill  . acetaminophen (TYLENOL) 500 MG tablet Take 1,000 mg by mouth every 6 (six) hours as needed for mild pain or headache.    . citalopram (CELEXA) 20 MG tablet Take 1 tablet (20 mg total) by mouth daily. 90 tablet 1  . hydrOXYzine (ATARAX/VISTARIL) 10 MG tablet Take 1-2 tablets (10-20 mg total) by mouth 3 (three) times daily as needed. 180 tablet 1  . ibuprofen (ADVIL,MOTRIN) 800 MG tablet Take 1 tablet (800 mg total) every 8 (eight) hours as needed by mouth. 60 tablet 1  . ondansetron (ZOFRAN-ODT) 4 MG disintegrating tablet Take 1 tablet (4 mg total) by  mouth every 8 (eight) hours as needed for nausea or vomiting. 20 tablet 0  . traZODone (DESYREL) 50 MG tablet Take 1-2 tablets (50-100 mg total) by mouth at bedtime as needed for sleep. 180 tablet 1   No current facility-administered medications for this visit.      Musculoskeletal: Strength & Muscle Tone: within normal limits Gait & Station: normal Patient leans: N/A  Psychiatric Specialty Exam: Review of Systems  Psychiatric/Behavioral: Positive for depression. The  patient is nervous/anxious and has insomnia.   All other systems reviewed and are negative.   Blood pressure 109/72, pulse 75, temperature 98 F (36.7 C), temperature source Oral, weight 120 lb 6.4 oz (54.6 kg).Body mass index is 18.86 kg/m.  General Appearance: Casual  Eye Contact:  Fair  Speech:  Clear and Coherent  Volume:  Normal  Mood:  Anxious  Affect:  Congruent  Thought Process:  Goal Directed and Descriptions of Associations: Intact  Orientation:  Full (Time, Place, and Person)  Thought Content: Logical   Suicidal Thoughts:  No  Homicidal Thoughts:  No  Memory:  Immediate;   Fair Recent;   Fair Remote;   Fair  Judgement:  Fair  Insight:  Fair  Psychomotor Activity:  Normal  Concentration:  Concentration: Fair and Attention Span: Fair  Recall:  [employer] of Knowledge: Fair  Language: Fair  Akathisia:  No  Handed:  Right  AIMS (if indicated): na  Assets:  Communication Skills Desire for Improvement Social Support  ADL's:  Intact  Cognition: WNL  Sleep:  varies   Screenings: PHQ2-9     Office Visit from 06/02/2017 in [facility] Office Visit from 11/20/2016 in [facility]  PHQ-2 Total Score  3  4  PHQ-9 Total Score  13  15       Assessment and Plan: Harper is a 27 year old African-American female who has a history of depression, anxiety, presented to the clinic today for a follow-up visit.  Patient continues to have psychosocial stressors including recent  separation from her husband some recent conflict with her friend.  She however reports she is coping better and she is communicating more with her girlfriend.  She reports she continues to be motivated to stay in psychotherapy with Ms. Peacock.  We will make medication readjustments as noted below.  Plan For depression Continue Celexa 20 mg p.o. daily Continue CBT with Ms. Peacock.  For anxiety symptoms Continue Celexa 20 mg p.o. Daily. Continue hydroxyzine 10-20 mg p.o. 3 times daily as needed for severe anxiety symptoms.  For insomnia Increase trazodone to 50-100 mg p.o. nightly as needed  Discussed with patient to continue to stay away from cannabis, alcohol.  She continues to have good social support from her friend.  Follow-up in clinic in 8 weeks or sooner if needed.  In the meantime she will continue psychotherapy with Ms. Peacock.  More than 50 % of the time was spent for psychoeducation and supportive psychotherapy and care coordination.  This note was generated in part or whole with voice recognition software. Voice recognition is usually quite accurate but there are transcription errors that can and very often do occur. I apologize for any typographical errors that were not detected and corrected.       Connie Longs, MD 09/10/2017, 12:50 PM

## 2017-09-10 ENCOUNTER — Encounter: Payer: Self-pay | Admitting: Psychiatry

## 2017-09-28 ENCOUNTER — Ambulatory Visit (INDEPENDENT_AMBULATORY_CARE_PROVIDER_SITE_OTHER): Payer: Managed Care, Other (non HMO) | Admitting: Licensed Clinical Social Worker

## 2017-09-28 DIAGNOSIS — F331 Major depressive disorder, recurrent, moderate: Secondary | ICD-10-CM

## 2017-09-28 DIAGNOSIS — F1211 Cannabis abuse, in remission: Secondary | ICD-10-CM | POA: Diagnosis not present

## 2017-09-28 NOTE — Progress Notes (Signed)
   THERAPIST PROGRESS NOTE  Session Time: 60min  Participation Level: Active  Behavioral Response: CasualAlertEuthymic  Type of Therapy: Individual Therapy  Treatment Goals addressed: Coping and Diagnosis: Depression  Interventions: CBT and Motivational Interviewing  Summary: Connie Guzman is a 27 y.o. female who presents with continued symptoms of her diagnosis.  Narrative therapy was used to assist patient with recent incidents of depression.  Explored pros and cons of engaging into a new relationship and being able to take it slow.  Discussion of financial ability to live alone and explored roommates to manage financially.  Patient reports that her mother has served papers on her to go to court.  Patient reprots that she is being optismistic and handling situations as they arise. Explored coping skills and triggers.  Suicidal/Homicidal: No   Plan: Return again in 2 weeks.  Diagnosis: Axis I: Depression    Axis II: No diagnosis    Marinda Elkicole M Ciaira Natividad, LCSW 08/14/2017

## 2017-10-05 ENCOUNTER — Ambulatory Visit: Payer: Managed Care, Other (non HMO) | Admitting: Licensed Clinical Social Worker

## 2017-10-21 ENCOUNTER — Ambulatory Visit (INDEPENDENT_AMBULATORY_CARE_PROVIDER_SITE_OTHER): Payer: Managed Care, Other (non HMO) | Admitting: Licensed Clinical Social Worker

## 2017-10-21 ENCOUNTER — Other Ambulatory Visit: Payer: Self-pay

## 2017-10-21 ENCOUNTER — Encounter: Payer: Self-pay | Admitting: Licensed Clinical Social Worker

## 2017-10-21 VITALS — BP 114/72 | HR 75 | Temp 98.5°F | Wt 119.2 lb

## 2017-10-21 DIAGNOSIS — F331 Major depressive disorder, recurrent, moderate: Secondary | ICD-10-CM

## 2017-10-22 NOTE — Progress Notes (Signed)
   THERAPIST PROGRESS NOTE  Session Time: 1hr 15 min  Participation Level: Active  Behavioral Response: CasualAlertEuthymic  Type of Therapy: Individual Therapy  Treatment Goals addressed: Coping  Interventions: CBT and Motivational Interviewing  Summary: Connie Guzman is a 27 y.o. female who presents with continued symptoms of her diagnosis.  Therapist met with Patient in an outpatient setting to assess current mood and assist with making progress towards goals through the use of therapeutic intervention. Therapist did a brief mood check, assessing anger, fear, disgust, excitement, happiness, and sadness.  Patient reports sadness and not feeling herself.  Patient vented her frustrations with her previous relationship and discussed the conversation with her ex girlfriend wife.  Reports that she now understands that she was manipulated.  Patient discussed how she felt when she learned her younger cousin committed suicide.  Explored family mental health history and the conflictual relationship she has with her mother and siblings.  Discussed in depth coping skills.  Practiced coping skills in session.  Reminded Patient of her protective factors and the crisis number.    Suicidal/Homicidal: No   Plan: Return again in 1 weeks.  Diagnosis: Axis I: Major Depression, Recurrent severe    Axis II: No diagnosis    Lubertha South, LCSW 10/22/2017

## 2017-10-29 ENCOUNTER — Ambulatory Visit (INDEPENDENT_AMBULATORY_CARE_PROVIDER_SITE_OTHER): Payer: Managed Care, Other (non HMO) | Admitting: Licensed Clinical Social Worker

## 2017-10-29 DIAGNOSIS — F331 Major depressive disorder, recurrent, moderate: Secondary | ICD-10-CM

## 2017-10-29 DIAGNOSIS — F411 Generalized anxiety disorder: Secondary | ICD-10-CM

## 2017-11-04 ENCOUNTER — Ambulatory Visit: Payer: Managed Care, Other (non HMO) | Admitting: Licensed Clinical Social Worker

## 2017-11-09 ENCOUNTER — Ambulatory Visit (INDEPENDENT_AMBULATORY_CARE_PROVIDER_SITE_OTHER): Payer: Managed Care, Other (non HMO) | Admitting: Psychiatry

## 2017-11-09 ENCOUNTER — Other Ambulatory Visit: Payer: Self-pay

## 2017-11-09 ENCOUNTER — Encounter: Payer: Self-pay | Admitting: Psychiatry

## 2017-11-09 VITALS — BP 104/71 | HR 68 | Temp 98.8°F | Wt 118.0 lb

## 2017-11-09 DIAGNOSIS — F1211 Cannabis abuse, in remission: Secondary | ICD-10-CM

## 2017-11-09 DIAGNOSIS — F411 Generalized anxiety disorder: Secondary | ICD-10-CM | POA: Diagnosis not present

## 2017-11-09 DIAGNOSIS — F331 Major depressive disorder, recurrent, moderate: Secondary | ICD-10-CM | POA: Diagnosis not present

## 2017-11-09 MED ORDER — ARIPIPRAZOLE 2 MG PO TABS: 2.0000 mg | ORAL_TABLET | Freq: Every day | ORAL | 1 refills | Status: DC

## 2017-11-09 NOTE — Progress Notes (Signed)
BH MD OP Progress Note  11/09/2017 12:06 PM Connie Verdene LennertCaprice Laham  MRN:  [id number]  Chief Complaint: ' I am here for follow up." Chief Complaint    Follow-up; Medication Refill     HPI: Connie Guzman is a 27 year old African-American female, employed, separated, lives in [place], has a history of depression, anxiety, presented to the clinic today for a follow-up visit.  Patient today reports she was going through a lot of situational stressors recently. One of her cousins committed suicide last month.  Patient reports she had to go home for the funeral and had a lot of relationship conflict with her mother and sisters.  She reports she also found out that her ex-girlfriend was cheating on her and hence broke up with her.  Patient currently is going to move in with new roommates soon and looks forward to that.  Patient reports she had an appointment with Joni ReiningNicole which helped and she has upcoming appointments which she wants to keep.  Patient reports sleep may be improving and she wants to stay on the trazodone as prescribed.  Patient reports work atmosphere as better than before and work place as more supportive.  Patient denies any suicidality at this time.  Patient denies any homicidality.  Patient denies any perceptual disturbances.  Patient continues to stay away from cannabis and alcohol. Visit Diagnosis:    ICD-10-CM   1. MDD (major depressive disorder), recurrent episode, moderate (HCC) F33.1 ARIPiprazole (ABILIFY) 2 MG tablet  2. 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 GAD (generalized anxiety disorder) F41.1     Past Psychiatric History: Reviewed past psychiatric history from my progress note on 06/29/2017.  Past Medical History:  Past Medical History:  Diagnosis Date  . Abnormal Pap smear of cervix   . Anemia   . Anxiety   . Chlamydia   . Depression   . Dysmenorrhea   . Heart murmur   . Inflammation of cervix     Past Surgical History:  Procedure  Laterality Date  . LEEP    . TONSILLECTOMY AND ADENOIDECTOMY  2009  . tubes in ears    . WISDOM TOOTH EXTRACTION  2012    Family Psychiatric History: Reviewed family psychiatric history from my progress note on 06/29/2017.  Family History:  Family History  Problem Relation Age of Onset  . Breast cancer Mother   . Hypertension Mother   . Alcohol abuse Father   . Breast cancer Maternal Grandmother   . Diabetes Maternal Grandmother   . Hyperlipidemia Maternal Grandmother   . Hypertension Maternal Grandmother   . Other Maternal Grandmother        fibroid uterus  . Bipolar disorder Sister   . Diabetes Sister        Borderline  . Anxiety disorder Sister   . Depression Sister   . Bipolar disorder Sister   . Anxiety disorder Sister   . Depression Sister   . Alcohol abuse Maternal Uncle   . Anxiety disorder Sister     Social History: Reviewed social history from my progress note on 06/29/2017. Social History   Socioeconomic History  . Marital status: Married    Spouse name: brandon  . Number of children: 0  . Years of education: Not on file  . Highest education level: Associate degree: occupational, Scientist, product/process developmenttechnical, or vocational program  Occupational History    Comment: full time  Social Needs  . Financial resource strain: Not hard at all  . Food insecurity:  Worry: Never true    Inability: Never true  . Transportation needs:    Medical: Yes    Non-medical: Yes  Tobacco Use  . Smoking status: Never Smoker  . Smokeless tobacco: Never Used  Substance and Sexual Activity  . Alcohol use: Yes    Alcohol/week: 0.0 - 1.0 standard drinks    Comment: Rarely  . Drug use: Yes    Types: Marijuana    Comment: last used 2 days ago   . Sexual activity: Yes    Partners: Male    Birth control/protection: None  Lifestyle  . Physical activity:    Days per week: 2 days    Minutes per session: 60 min  . Stress: Very much  Relationships  . Social connections:    Talks on phone: More  than three times a week    Gets together: Never    Attends religious service: Never    Active member of club or organization: No    Attends meetings of clubs or organizations: Never    Relationship status: Married  Other Topics Concern  . Not on file  Social History Narrative  . Not on file    Allergies:  Allergies  Allergen Reactions  . Chocolate Anaphylaxis, Hives and Other (See Comments)    Reaction:  Migraines and dizziness     Metabolic Disorder Labs: Lab Results  Component Value Date   HGBA1C 4.8 11/21/2016   Lab Results  Component Value Date   PROLACTIN 11.4 08/06/2012   Lab Results  Component Value Date   CHOL 109 11/21/2016   TRIG 50 11/21/2016   HDL 49 11/21/2016   LDLCALC 50 11/21/2016   Lab Results  Component Value Date   TSH 0.625 11/21/2016   TSH 0.913 12/10/2014    Therapeutic Level Labs: No results found for: LITHIUM No results found for: VALPROATE No components found for:  CBMZ  Current Medications: Current Outpatient Medications  Medication Sig Dispense Refill  . acetaminophen (TYLENOL) 500 MG tablet Take 1,000 mg by mouth every 6 (six) hours as needed for mild pain or headache.    . citalopram (CELEXA) 20 MG tablet Take 1 tablet (20 mg total) by mouth daily. 90 tablet 1  . hydrOXYzine (ATARAX/VISTARIL) 10 MG tablet Take 1-2 tablets (10-20 mg total) by mouth 3 (three) times daily as needed. 180 tablet 1  . hydrOXYzine (VISTARIL) 25 MG capsule     . ibuprofen (ADVIL,MOTRIN) 800 MG tablet Take 1 tablet (800 mg total) every 8 (eight) hours as needed by mouth. 60 tablet 1  . Nutritional Supplements (CARNATION BREAKFAST ESSENTIALS) LIQD Take by mouth.    . ondansetron (ZOFRAN-ODT) 4 MG disintegrating tablet Take 1 tablet (4 mg total) by mouth every 8 (eight) hours as needed for nausea or vomiting. 20 tablet 0  . traZODone (DESYREL) 50 MG tablet Take 1-2 tablets (50-100 mg total) by mouth at bedtime as needed for sleep. 180 tablet 1  . ARIPiprazole  (ABILIFY) 2 MG tablet Take 1 tablet (2 mg total) by mouth daily. 90 tablet 1   No current facility-administered medications for this visit.      Musculoskeletal: Strength & Muscle Tone: within normal limits Gait & Station: normal Patient leans: N/A  Psychiatric Specialty Exam: Review of Systems  Psychiatric/Behavioral: Positive for depression. The patient is nervous/anxious.   All other systems reviewed and are negative.   Blood pressure 104/71, pulse 68, temperature 98.8 F (37.1 C), temperature source Oral, weight 118 lb (53.5 kg).Body mass  index is 18.48 kg/m.  General Appearance: Casual  Eye Contact:  Fair  Speech:  Clear and Coherent  Volume:  Normal  Mood:  Anxious, Depressed and Dysphoric  Affect:  Congruent  Thought Process:  Goal Directed and Descriptions of Associations: Intact  Orientation:  Full (Time, Place, and Person)  Thought Content: Logical   Suicidal Thoughts:  No  Homicidal Thoughts:  No  Memory:  Immediate;   Fair Recent;   Fair Remote;   Fair  Judgement:  Fair  Insight:  Fair  Psychomotor Activity:  Normal  Concentration:  Concentration: Fair and Attention Span: Fair  Recall:  [employer]  Fund of Knowledge: Fair  Language: Fair  Akathisia:  No  Handed:  Right  AIMS (if indicated): na  Assets:  Communication Skills Desire for Improvement Housing  ADL's:  Intact  Cognition: WNL  Sleep:  improving   Screenings: PHQ2-9     [facility]/2016 in [facility]  PHQ-2 Total Score  3  4  PHQ-9 Total Score  13  15       Assessment and Plan: Lady is a 27 year old African-American female who has a history of depression, anxiety, presented to the clinic today for a follow-up visit.  Patient continues to have psychosocial stressors including relationship struggles.  We will continue to make medication readjustment as noted below.  Plan For depression Continue Celexa 20  mg p.o. daily Add Abilify 2 mg p.o. daily Continue CBT with Ms. Peacock.  For anxiety symptoms Continue Celexa 20 mg p.o. daily Continue hydroxyzine 10-20 mg p.o. 3 times daily as needed for severe anxiety symptoms  For insomnia Trazodone 50-100 mg p.o. nightly as needed  Cannabis use disorder in early remission Patient continues to remain sober.  Patient continues to have good social support from her friend as well as workplace.  She will continue psychotherapy as scheduled with Ms. Peacock.  Follow-up in clinic in 2 weeks or sooner if needed.  More than 50 % of the time was spent for psychoeducation and supportive psychotherapy and care coordination.  This note was generated in part or whole with voice recognition software. Voice recognition is usually quite accurate but there are transcription errors that can and very often do occur. I apologize for any typographical errors that were not detected and corrected.         Jomarie LongsSaramma Nabil Bubolz, MD 11/09/2017, 12:06 PM

## 2017-11-09 NOTE — Patient Instructions (Signed)
Aripiprazole tablets What is this medicine? ARIPIPRAZOLE (ay ri PIP ray zole) is an atypical antipsychotic. It is used to treat schizophrenia and bipolar disorder, also known as manic-depression. It is also used to treat Tourette's disorder and some symptoms of autism. This medicine may also be used in combination with antidepressants to treat major depressive disorder. This medicine may be used for other purposes; ask your health care provider or pharmacist if you have questions. COMMON BRAND NAME(S): Abilify What should I tell my health care provider before I take this medicine? They need to know if you have any of these conditions: -dehydration -dementia -diabetes -heart disease -history of stroke -low blood counts, like low white cell, platelet, or red cell counts -Parkinson's disease -seizures -suicidal thoughts, plans, or attempt; a previous suicide attempt by you or a family member -an unusual or allergic reaction to aripiprazole, other medicines, foods, dyes, or preservatives -pregnant or trying to get pregnant -breast-feeding How should I use this medicine? Take this medicine by mouth with a glass of water. Follow the directions on the prescription label. You can take this medicine with or without food. Take your doses at regular intervals. Do not take your medicine more often than directed. Do not stop taking except on the advice of your doctor or health care professional. A special MedGuide will be given to you by the pharmacist with each prescription and refill. Be sure to read this information carefully each time. Talk to your pediatrician regarding the use of this medicine in children. While this drug may be prescribed for children as young as 6 years of age for selected conditions, precautions do apply. Overdosage: If you think you have taken too much of this medicine contact a poison control center or emergency room at once. NOTE: This medicine is only for you. Do not share  this medicine with others. What if I miss a dose? If you miss a dose, take it as soon as you can. If it is almost time for your next dose, take only that dose. Do not take double or extra doses. What may interact with this medicine? Do not take this medicine with any of the following medications: -brexpiprazole -cisapride -dofetilide -dronedarone -metoclopramide -pimozide -thioridazine This medicine may also interact with the following medications: -alcohol -carbamazepine -certain medicines for anxiety or sleep -certain medicines for blood pressure -certain medicines for fungal infections like ketoconazole, fluconazole, posaconazole, and itraconazole -clarithromycin -fluoxetine -other medicines that prolong the QT interval (cause an abnormal heart rhythm) -paroxetine -quinidine -rifampin This list may not describe all possible interactions. Give your health care provider a list of all the medicines, herbs, non-prescription drugs, or dietary supplements you use. Also tell them if you smoke, drink alcohol, or use illegal drugs. Some items may interact with your medicine. What should I watch for while using this medicine? Visit your doctor or health care professional for regular checks on your progress. It may be several weeks before you see the full effects of this medicine. Do not suddenly stop taking this medicine. You may need to gradually reduce the dose. Patients and their families should watch out for worsening depression or thoughts of suicide. Also watch out for sudden changes in feelings such as feeling anxious, agitated, panicky, irritable, hostile, aggressive, impulsive, severely restless, overly excited and hyperactive, or not being able to sleep. If this happens, especially at the beginning of antidepressant treatment or after a change in dose, call your health care professional. You may get dizzy or drowsy. Do   not drive, use machinery, or do anything that needs mental  alertness until you know how this medicine affects you. Do not stand or sit up quickly, especially if you are an older patient. This reduces the risk of dizzy or fainting spells. Alcohol can increase dizziness and drowsiness. Avoid alcoholic drinks. This medicine can reduce the response of your body to heat or cold. Dress warm in cold weather and stay hydrated in hot weather. If possible, avoid extreme temperatures like saunas, hot tubs, very hot or cold showers, or activities that can cause dehydration such as vigorous exercise. This medicine may cause dry eyes and blurred vision. If you wear contact lenses you may feel some discomfort. Lubricating drops may help. See your eye doctor if the problem does not go away or is severe. If you notice an increased hunger or thirst, different from your normal hunger or thirst, or if you find that you have to urinate more frequently, you should contact your health care provider as soon as possible. You may need to have your blood sugar monitored. This medicine may cause changes in your blood sugar levels. You should monitor you blood sugar frequently if you have diabetes. There have been reports of uncontrollable and strong urges to gamble, binge eat, shop, and have sex while taking this medicine. If you experience any of these or other uncontrollable and strong urges while taking this medicine, you should report it to your health care provider as soon as possible. What side effects may I notice from receiving this medicine? Side effects that you should report to your doctor or health care professional as soon as possible: -allergic reactions like skin rash, itching or hives, swelling of the face, lips, or tongue -breathing problems -confusion -feeling faint or lightheaded, falls -fever or chills, sore throat -increased hunger or thirst -increased urination -joint pain -muscles pain, spasms -problems with balance, talking, walking -restlessness or need to  keep moving -seizures -suicidal thoughts or other mood changes -trouble swallowing -uncontrollable and excessive urges (examples: gambling, binge eating, shopping, having sex) -uncontrollable head, mouth, neck, arm, or leg movements -unusually weak or tired Side effects that usually do not require medical attention (report to your doctor or health care professional if they continue or are bothersome): -blurred vision -constipation -headache -nausea, vomiting -trouble sleeping -weight gain This list may not describe all possible side effects. Call your doctor for medical advice about side effects. You may report side effects to FDA at 1-800-FDA-1088. Where should I keep my medicine? Keep out of the reach of children. Store at room temperature between 15 and 30 degrees C (59 and 86 degrees F). Throw away any unused medicine after the expiration date. NOTE: This sheet is a summary. It may not cover all possible information. If you have questions about this medicine, talk to your doctor, pharmacist, or health care provider.  2018 Elsevier/Gold Standard (2016-03-02 11:45:05)  

## 2017-11-13 ENCOUNTER — Ambulatory Visit: Payer: Managed Care, Other (non HMO) | Admitting: Licensed Clinical Social Worker

## 2017-11-23 ENCOUNTER — Ambulatory Visit: Payer: Managed Care, Other (non HMO) | Admitting: Psychiatry
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
## 2017-12-08 ENCOUNTER — Ambulatory Visit: Payer: Managed Care, Other (non HMO) | Admitting: Licensed Clinical Social Worker

## 2017-12-08 DIAGNOSIS — F331 Major depressive disorder, recurrent, moderate: Secondary | ICD-10-CM | POA: Diagnosis not present

## 2017-12-15 NOTE — Progress Notes (Signed)
   THERAPIST PROGRESS NOTE  Session Time: 60min  Participation Level: Active  Behavioral Response: CasualAlertEuthymic  Type of Therapy: Individual Therapy  Treatment Goals addressed: Coping  Interventions: CBT and Motivational Interviewing  Summary: Stepheny Caprice Barth is a 26 y.o. female who presents with continued symptoms of her diagnosis.  Therapist met with Patient in an outpatient setting to assess current mood and assist with making progress towards goals through the use of therapeutic intervention. Therapist did a brief mood check, assessing anger, fear, disgust, excitement, happiness, and sadness.  Patient reports her mood as "aggitated".  Patient reports that she has had to go into her 401K to get money to pay her portion of the rent.  Patient reports anxiety when she thinks about her ex girlfriend.  She reports that she is not ready to see or face her.  Normalized her feelings and explored pros and cons of the previous relationship in order to make better decisions. Discussed coping skills and safe people to vent to.  Discussed increasing social involvement.    Suicidal/Homicidal: No  Plan: Return again in 2 weeks.  Diagnosis: Axis I: Depression    Axis II: No diagnosis    Nicole M Peacock, LCSW 12/08/2017  

## 2017-12-21 ENCOUNTER — Ambulatory Visit: Payer: Managed Care, Other (non HMO) | Admitting: Licensed Clinical Social Worker
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
## 2017-12-30 ENCOUNTER — Ambulatory Visit (INDEPENDENT_AMBULATORY_CARE_PROVIDER_SITE_OTHER): Payer: Managed Care, Other (non HMO) | Admitting: Licensed Clinical Social Worker

## 2017-12-30 DIAGNOSIS — F331 Major depressive disorder, recurrent, moderate: Secondary | ICD-10-CM

## 2017-12-30 NOTE — Progress Notes (Signed)
   THERAPIST PROGRESS NOTE  Session Time: 60 min  Participation Level: Active  Behavioral Response: CasualAlertEuthymic  Type of Therapy: Individual Therapy  Treatment Goals addressed: Coping  Interventions: CBT and Motivational Interviewing  Summary: Connie Guzman is a 27 y.o. female who presents with continued symptoms of her goals. Patient reports "favorable" mood. Patient reports current stressors (roommates, neighbors).  Patient reports tension in the apartment due to her dog barking.  She reports that her neighbors have complained and her roommates are frustrated with her.  Discussion of possible solutions.  Related back to her emotional distress.  Reveiwed symptoms that are currently present.  Discussion of cognitive distortions.  Suicidal/Homicidal: No   Plan: Return again in2 weeks.  Diagnosis: Axis I: Depression    Axis II: No diagnosis    Marinda Elk, LCSW 12/30/2017

## 2018-01-05 ENCOUNTER — Encounter: Payer: Self-pay | Admitting: Psychiatry

## 2018-01-05 ENCOUNTER — Ambulatory Visit: Payer: Managed Care, Other (non HMO) | Admitting: Psychiatry

## 2018-01-05 ENCOUNTER — Telehealth: Payer: Self-pay | Admitting: Psychiatry

## 2018-01-05 ENCOUNTER — Other Ambulatory Visit: Payer: Self-pay

## 2018-01-05 VITALS — BP 103/67 | HR 68 | Temp 99.0°F | Ht 67.0 in | Wt 117.8 lb

## 2018-01-05 DIAGNOSIS — F1211 Cannabis abuse, in remission: Secondary | ICD-10-CM | POA: Diagnosis not present

## 2018-01-05 DIAGNOSIS — F411 Generalized anxiety disorder: Secondary | ICD-10-CM | POA: Diagnosis not present

## 2018-01-05 DIAGNOSIS — F331 Major depressive disorder, recurrent, moderate: Secondary | ICD-10-CM | POA: Diagnosis not present

## 2018-01-05 MED ORDER — CITALOPRAM HYDROBROMIDE 20 MG PO TABS: 30.0000 mg | ORAL_TABLET | Freq: Every day | ORAL | 0 refills | Status: DC

## 2018-01-05 MED ORDER — ARIPIPRAZOLE 5 MG PO TABS: 5.0000 mg | ORAL_TABLET | Freq: Every day | ORAL | 0 refills | Status: DC

## 2018-01-05 NOTE — Telephone Encounter (Signed)
Patient needs a letter stating she can change to night shift . She has done some night shifts recently and did well. Will let Shanda Bumps CMA know . Once letter completed pt needs to be contacted to come pick up letter.

## 2018-01-05 NOTE — Telephone Encounter (Signed)
Letter typed and printed and signed.    Tried to call pt but pt voicemail is not set up and no message could be left.

## 2018-01-05 NOTE — Progress Notes (Signed)
[facility] MD OP Progress Note  01/05/2018 10:46 AM Connie Guzman  MRN:  [id number]  Chief Complaint: ' I am here for follow up." Chief Complaint    Follow-up; Medication Refill     HPI: Connie Guzman is a 27 year old African-American female, employed, separated, lives in [place], has a history of depression, anxiety, presented to the clinic today for a follow-up visit.  Patient today reports she continues to have some stressors going on.  She reports her ex-girlfriend recently was assigned to her floor at work.  She reports this brought back a lot of memories which made her angry and irritable.  She reports she went to work this morning and left since she could not tolerate being around her ex-girlfriend.  Patient reports her supervisor will get in touch with her soon and she is going to ask for night shifts.  She reports she has tried a few night shifts recently and she did really well.  She reports her workload is also less during the night shift.  She reports she has no difficulty sleeping during the day .  Reports she would like to try the night shift again to see if this would work out for her better.  Patient reports she is tolerating her Celexa well.  She would like her dosage increase with her medications today.  She denies any side effects to the Abilify.  Patient continues to take trazodone as prescribed for sleep.  She reports she currently lives with 2 roommates in [place].  She reports her living situation as okay.  She reports she has upcoming appointments with Joni Reining our therapist and she looks forward to the same.  She currently denies any suicidality or homicidality.  She denies any perceptual disturbances.  Continues to stay away from cannabis. Visit Diagnosis:    ICD-10-CM   1. MDD (major depressive disorder), recurrent episode, moderate (HCC) F33.1 ARIPiprazole (ABILIFY) 5 MG tablet    citalopram (CELEXA) 20 MG tablet  2. GAD (generalized anxiety disorder) F41.1  ARIPiprazole (ABILIFY) 5 MG tablet    citalopram (CELEXA) 20 MG tablet  3. 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  Past Medical History:  Past Medical History:  Diagnosis Date  . Abnormal Pap smear of cervix   . Anemia   . Anxiety   . Chlamydia   . Depression   . Dysmenorrhea   . Heart murmur   . Inflammation of cervix     Past Surgical History:  Procedure Laterality Date  . LEEP    . TONSILLECTOMY AND ADENOIDECTOMY  2009  . tubes in ears    . WISDOM TOOTH EXTRACTION  2012    Family Psychiatric History: I have reviewed family psychiatric history from my progress note on 06/29/2017  Family History:  Family History  Problem Relation Age of Onset  . Breast cancer Mother   . Hypertension Mother   . Alcohol abuse Father   . Breast cancer Maternal Grandmother   . Diabetes Maternal Grandmother   . Hyperlipidemia Maternal Grandmother   . Hypertension Maternal Grandmother   . Other Maternal Grandmother        fibroid uterus  . Bipolar disorder Sister   . Diabetes Sister        Borderline  . Anxiety disorder Sister   . Depression Sister   . Bipolar disorder Sister   . Anxiety disorder Sister   . Depression  Sister   . Alcohol abuse Maternal Uncle   . Anxiety disorder Sister     Social History: Have reviewed social history from my progress note on 06/29/2017 Social History   Socioeconomic History  . Marital status: Married    Spouse name: brandon  . Number of children: 0  . Years of education: Not on file  . Highest education level: Associate degree: occupational, Scientist, product/process development, or vocational program  Occupational History    Comment: full time  Social Needs  . Financial resource strain: Not hard at all  . Food insecurity:    Worry: Never true    Inability: Never true  . Transportation needs:    Medical: Yes    Non-medical: Yes  Tobacco Use  . Smoking  status: Never Smoker  . Smokeless tobacco: Never Used  Substance and Sexual Activity  . Alcohol use: Yes    Alcohol/week: 0.0 - 1.0 standard drinks    Comment: Rarely  . Drug use: Yes    Types: Marijuana    Comment: last used 2 days ago   . Sexual activity: Yes    Partners: Male    Birth control/protection: None  Lifestyle  . Physical activity:    Days per week: 2 days    Minutes per session: 60 min  . Stress: Very much  Relationships  . Social connections:    Talks on phone: More than three times a week    Gets together: Never    Attends religious service: Never    Active member of club or organization: No    Attends meetings of clubs or organizations: Never    Relationship status: Married  Other Topics Concern  . Not on file  Social History Narrative  . Not on file    Allergies:  Allergies  Allergen Reactions  . Chocolate Anaphylaxis, Hives and Other (See Comments)    Reaction:  Migraines and dizziness     Metabolic Disorder Labs: Lab Results  Component Value Date   HGBA1C 4.8 11/21/2016   Lab Results  Component Value Date   PROLACTIN 11.4 08/06/2012   Lab Results  Component Value Date   CHOL 109 11/21/2016   TRIG 50 11/21/2016   HDL 49 11/21/2016   LDLCALC 50 11/21/2016   Lab Results  Component Value Date   TSH 0.625 11/21/2016   TSH 0.913 12/10/2014    Therapeutic Level Labs: No results found for: LITHIUM No results found for: VALPROATE No components found for:  CBMZ  Current Medications: Current Outpatient Medications  Medication Sig Dispense Refill  . acetaminophen (TYLENOL) 500 MG tablet Take 1,000 mg by mouth every 6 (six) hours as needed for mild pain or headache.    . citalopram (CELEXA) 20 MG tablet Take 1.5 tablets (30 mg total) by mouth daily. 135 tablet 0  . hydrOXYzine (ATARAX/VISTARIL) 10 MG tablet Take 1-2 tablets (10-20 mg total) by mouth 3 (three) times daily as needed. 180 tablet 1  . hydrOXYzine (VISTARIL) 25 MG capsule      . ibuprofen (ADVIL,MOTRIN) 800 MG tablet Take 1 tablet (800 mg total) every 8 (eight) hours as needed by mouth. 60 tablet 1  . Nutritional Supplements (CARNATION BREAKFAST ESSENTIALS) LIQD Take by mouth.    . ondansetron (ZOFRAN-ODT) 4 MG disintegrating tablet Take 1 tablet (4 mg total) by mouth every 8 (eight) hours as needed for nausea or vomiting. 20 tablet 0  . traZODone (DESYREL) 50 MG tablet Take 1-2 tablets (50-100 mg total) by mouth at bedtime  as needed for sleep. 180 tablet 1  . ARIPiprazole (ABILIFY) 5 MG tablet Take 1 tablet (5 mg total) by mouth daily. 90 tablet 0   No current facility-administered medications for this visit.      Musculoskeletal: Strength & Muscle Tone: within normal limits Gait & Station: normal Patient leans: N/A  Psychiatric Specialty Exam: Review of Systems  Psychiatric/Behavioral: Positive for depression. The patient is nervous/anxious.   All other systems reviewed and are negative.   Blood pressure 103/67, pulse 68, temperature 99 F (37.2 C), temperature source Oral, height 5\' 7"  (1.702 m), weight 117 lb 12.8 oz (53.4 kg).Body mass index is 18.45 kg/m.  General Appearance: Casual  Eye Contact:  Fair  Speech:  Normal Rate  Volume:  Normal  Mood:  Anxious and Dysphoric  Affect:  Congruent  Thought Process:  Goal Directed and Descriptions of Associations: Intact  Orientation:  Full (Time, Place, and Person)  Thought Content: Logical   Suicidal Thoughts:  No  Homicidal Thoughts:  No  Memory:  Immediate;   Fair Recent;   Fair Remote;   Fair  Judgement:  Fair  Insight:  Fair  Psychomotor Activity:  Normal  Concentration:  Concentration: Fair and Attention Span: Fair  Recall:  [employer] of Knowledge: Fair  Language: Fair  Akathisia:  No  Handed:  Right  AIMS (if indicated): denies tremors, rigidity  Assets:  Communication Skills Desire for Improvement Social Support  ADL's:  Intact  Cognition: WNL  Sleep:  Fair    Screenings: PHQ2-9     Office Visit from 06/02/2017 in [facility] Office Visit from 11/20/2016 in [facility]  PHQ-2 Total Score  3  4  PHQ-9 Total Score  13  15       Assessment and Plan: Helene is a 27 year old African-American female who has a history of depression, anxiety, presented to the clinic today for a follow-up visit.  Patient continues to struggle with psychosocial stressors of relationship issues.  Patient also wants to make another shift change at work since she wants to stay away from her ex-girlfriend.  Patient continues to report she is compliant with her medication and is motivated to stay in therapy.  Plan For depression Increase Celexa to 30 mg p.o. daily Increase Abilify to 5 mg p.o. daily Continue CBT with Ms. Peacock.  Anxiety symptoms Continue Celexa, increased dosage to 30 mg p.o. daily Hydroxyzine 10-20 mg p.o. 3 times daily as needed for severe anxiety symptoms  For insomnia Trazodone 50-100 mg p.o. nightly as needed  Cannabis use disorder in early remission Patient continues to remain sober  Patient will continue psychotherapy with Ms. Peacock  Patient reports she would like to change her shift at work back to night shift since she wants to stay away from ex-girlfriend with whom she has relationship struggles and also feels her workload will be less.  Follow-up in clinic in 2 weeks or sooner if needed.  More than 50 % of the time was spent for psychoeducation and supportive psychotherapy and care coordination.  This note was generated in part or whole with voice recognition software. Voice recognition is usually quite accurate but there are transcription errors that can and very often do occur. I apologize for any typographical errors that were not detected and corrected.         Jomarie Longs, MD 01/05/2018, 10:46 AM

## 2018-01-12 ENCOUNTER — Encounter: Payer: Self-pay | Admitting: Physician Assistant

## 2018-01-13 ENCOUNTER — Ambulatory Visit: Payer: Managed Care, Other (non HMO) | Admitting: Licensed Clinical Social Worker

## 2018-01-20 ENCOUNTER — Ambulatory Visit: Payer: Managed Care, Other (non HMO) | Admitting: Psychiatry

## 2018-01-20 NOTE — Progress Notes (Deleted)
BH MD OP Progress Note  01/20/2018 9:29 AM Connie Guzman  MRN:  [id number]  Chief Complaint: ' I am here for follow up.'  HPI: Connie Guzman is a 27 yr old [place] American female, employed, separated, lives in [place], has a history of depression, anxiety, presented to the clinic today for a follow-up visit.      Visit Diagnosis: No diagnosis found.  Past Psychiatric History: Have reviewed past psychiatric history from my progress note on 06/29/2017  Past Medical History:  Past Medical History:  Diagnosis Date  . Abnormal Pap smear of cervix   . Anemia   . Anxiety   . Chlamydia   . Depression   . Dysmenorrhea   . Heart murmur   . Inflammation of cervix     Past Surgical History:  Procedure Laterality Date  . LEEP    . TONSILLECTOMY AND ADENOIDECTOMY  2009  . tubes in ears    . WISDOM TOOTH EXTRACTION  2012    Family Psychiatric History: Have reviewed family psychiatric history from my progress note on 06/29/2017.  Family History:  Family History  Problem Relation Age of Onset  . Breast cancer Mother   . Hypertension Mother   . Alcohol abuse Father   . Breast cancer Maternal Grandmother   . Diabetes Maternal Grandmother   . Hyperlipidemia Maternal Grandmother   . Hypertension Maternal Grandmother   . Other Maternal Grandmother        fibroid uterus  . Bipolar disorder Sister   . Diabetes Sister        Borderline  . Anxiety disorder Sister   . Depression Sister   . Bipolar disorder Sister   . Anxiety disorder Sister   . Depression Sister   . Alcohol abuse Maternal Uncle   . Anxiety disorder Sister     Social History: Have reviewed social history from my progress note on 06/29/2017 Social History   Socioeconomic History  . Marital status: Married    Spouse name: brandon  . Number of children: 0  . Years of education: Not on file  . Highest education level: Associate degree: occupational, Scientist, product/process development, or vocational program  Occupational History   Comment: full time  Social Needs  . Financial resource strain: Not hard at all  . Food insecurity:    Worry: Never true    Inability: Never true  . Transportation needs:    Medical: Yes    Non-medical: Yes  Tobacco Use  . Smoking status: Never Smoker  . Smokeless tobacco: Never Used  Substance and Sexual Activity  . Alcohol use: Yes    Alcohol/week: 0.0 - 1.0 standard drinks    Comment: Rarely  . Drug use: Yes    Types: Marijuana    Comment: last used 2 days ago   . Sexual activity: Yes    Partners: Male    Birth control/protection: None  Lifestyle  . Physical activity:    Days per week: 2 days    Minutes per session: 60 min  . Stress: Very much  Relationships  . Social connections:    Talks on phone: More than three times a week    Gets together: Never    Attends religious service: Never    Active member of club or organization: No    Attends meetings of clubs or organizations: Never    Relationship status: Married  Other Topics Concern  . Not on file  Social History Narrative  . Not on file  Allergies:  Allergies  Allergen Reactions  . Chocolate Anaphylaxis, Hives and Other (See Comments)    Reaction:  Migraines and dizziness     Metabolic Disorder Labs: Lab Results  Component Value Date   HGBA1C 4.8 11/21/2016   Lab Results  Component Value Date   PROLACTIN 11.4 08/06/2012   Lab Results  Component Value Date   CHOL 109 11/21/2016   TRIG 50 11/21/2016   HDL 49 11/21/2016   LDLCALC 50 11/21/2016   Lab Results  Component Value Date   TSH 0.625 11/21/2016   TSH 0.913 12/10/2014    Therapeutic Level Labs: No results found for: LITHIUM No results found for: VALPROATE No components found for:  CBMZ  Current Medications: Current Outpatient Medications  Medication Sig Dispense Refill  . acetaminophen (TYLENOL) 500 MG tablet Take 1,000 mg by mouth every 6 (six) hours as needed for mild pain or headache.    . ARIPiprazole (ABILIFY) 5 MG tablet  Take 1 tablet (5 mg total) by mouth daily. 90 tablet 0  . citalopram (CELEXA) 20 MG tablet Take 1.5 tablets (30 mg total) by mouth daily. 135 tablet 0  . hydrOXYzine (ATARAX/VISTARIL) 10 MG tablet Take 1-2 tablets (10-20 mg total) by mouth 3 (three) times daily as needed. 180 tablet 1  . hydrOXYzine (VISTARIL) 25 MG capsule     . ibuprofen (ADVIL,MOTRIN) 800 MG tablet Take 1 tablet (800 mg total) every 8 (eight) hours as needed by mouth. 60 tablet 1  . Nutritional Supplements (CARNATION BREAKFAST ESSENTIALS) LIQD Take by mouth.    . ondansetron (ZOFRAN-ODT) 4 MG disintegrating tablet Take 1 tablet (4 mg total) by mouth every 8 (eight) hours as needed for nausea or vomiting. 20 tablet 0  . traZODone (DESYREL) 50 MG tablet Take 1-2 tablets (50-100 mg total) by mouth at bedtime as needed for sleep. 180 tablet 1   No current facility-administered medications for this visit.      Musculoskeletal: Strength & Muscle Tone: {desc; muscle tone:32375} Gait & Station: {PE GAIT ED ZOXW:96045} Patient leans: {Patient Leans:21022755}  Psychiatric Specialty Exam: ROS  There were no vitals taken for this visit.There is no height or weight on file to calculate BMI.  General Appearance: {Appearance:22683}  Eye Contact:  {BHH EYE CONTACT:22684}  Speech:  {Speech:22685}  Volume:  {Volume (PAA):22686}  Mood:  {BHH MOOD:22306}  Affect:  {Affect (PAA):22687}  Thought Process:  {Thought Process (PAA):22688}  Orientation:  {BHH ORIENTATION (PAA):22689}  Thought Content: {Thought Content:22690}   Suicidal Thoughts:  {ST/HT (PAA):22692}  Homicidal Thoughts:  {ST/HT (PAA):22692}  Memory:  {BHH MEMORY:22881}  Judgement:  {Judgement (PAA):22694}  Insight:  {Insight (PAA):22695}  Psychomotor Activity:  {Psychomotor (PAA):22696}  Concentration:  {Concentration:21399}  Recall:  {BHH GOOD/FAIR/POOR:22877}  Fund of Knowledge: {BHH GOOD/FAIR/POOR:22877}  Language: {BHH GOOD/FAIR/POOR:22877}  Akathisia:  {BHH YES  OR NO:22294}  Handed:  {Handed:22697}  AIMS (if indicated): {Desc; done/not:10129}  Assets:  {Assets (PAA):22698}  ADL's:  {BHH WUJ'W:11914}  Cognition: {chl bhh cognition:304700322}  Sleep:  {BHH GOOD/FAIR/POOR:22877}   Screenings: PHQ2-9     Office Visit from 06/02/2017 in [facility] Office Visit from 11/20/2016 in [facility]  PHQ-2 Total Score  3  4  PHQ-9 Total Score  13  15       Assessment and Plan:  Shailynn is a 27 year old African-American female who has a history of depression, anxiety, presented to the clinic today for a follow-up visit.  Patient has psychosocial stressors of relationship issues.  Jomarie Longs, MD 01/20/2018, 9:29 AM

## 2018-01-22 ENCOUNTER — Encounter: Payer: Self-pay | Admitting: Physician Assistant

## 2018-05-19 ENCOUNTER — Ambulatory Visit (INDEPENDENT_AMBULATORY_CARE_PROVIDER_SITE_OTHER): Payer: Managed Care, Other (non HMO) | Admitting: Physician Assistant

## 2018-05-19 ENCOUNTER — Encounter: Payer: Self-pay | Admitting: Physician Assistant

## 2018-05-19 VITALS — BP 103/71 | HR 75 | Temp 98.3°F | Resp 16 | Wt 122.0 lb

## 2018-05-19 DIAGNOSIS — F419 Anxiety disorder, unspecified: Secondary | ICD-10-CM | POA: Diagnosis not present

## 2018-05-19 DIAGNOSIS — F329 Major depressive disorder, single episode, unspecified: Secondary | ICD-10-CM | POA: Diagnosis not present

## 2018-05-19 DIAGNOSIS — F32A Depression, unspecified: Secondary | ICD-10-CM
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
## 2018-05-19 NOTE — Patient Instructions (Signed)
Depression Screening Depression screening is a tool that your health care provider can use to learn if you have symptoms of depression. Depression is a common condition with many symptoms that are also often found in other conditions. Depression is treatable, but it must first be diagnosed. You may not know that certain feelings, thoughts, and behaviors that you are having can be symptoms of depression. Taking a depression screening test can help you and your health care provider decide if you need more assessment, or if you should be referred to a mental health care provider. What are the screening tests?  You may have a physical exam to see if another condition is affecting your mental health. You may have a blood or urine sample taken during the physical exam.  You may be interviewed using a screening tool that was developed from research, such as one of these: ? Patient Health Questionnaire (PHQ). This is a set of either 2 or 9 questions. A health care provider who has been trained to score this screening test uses a guide to assess if your symptoms suggest that you may have depression. ? Hamilton Depression Rating Scale (HAM-D). This is a set of either 17 or 24 questions. You may be asked to take it again during or after your treatment, to see if your depression has gotten better. ? Beck Depression Inventory (BDI). This is a set of 21 multiple choice questions. Your health care provider scores your answers to assess:  Your level of depression, ranging from mild to severe.  Your response to treatment.  Your health care provider may talk with you about your daily activities, such as eating, sleeping, work, and recreation, and ask if you have had any changes in activity.  Your health care provider may ask you to see a mental health specialist, such as a psychiatrist or psychologist, for more evaluation. Who should be screened for depression?   All adults, including adults with a family history  of a mental health disorder.  Adolescents who are 12-18 years old.  People who are recovering from a myocardial infarction (MI).  Pregnant women, or women who have given birth.  People who have a long-term (chronic) illness.  Anyone who has been diagnosed with another type of a mental health disorder.  Anyone who has symptoms that could show depression. What do my results mean? Your health care provider will review the results of your depression screening, physical exam, and lab tests. Positive screens suggest that you may have depression. Screening is the first step in getting the care that you may need. It is up to you to get your screening results. Ask your health care provider, or the department that is doing your screening tests, when your results will be ready. Talk with your health care provider about your results and diagnosis. A diagnosis of depression is made using the Diagnostic and Statistical Manual of Mental Disorders (DSM-V). This is a book that lists the number and type of symptoms that must be present for a health care provider to give a specific diagnosis.  Your health care provider may work with you to treat your symptoms of depression, or your health care provider may help you find a mental health provider who can assess, diagnose, and treat your depression. Get help right away if:  You have thoughts about hurting yourself or others. If you ever feel like you may hurt yourself or others, or have thoughts about taking your own life, get help right away. You   can go to your nearest emergency department or call:  Your local emergency services (911 in the [place]  A suicide crisis helpline, such as the National Suicide Prevention Lifeline at 1-800-273-8255. This is open 24 hours a day. Summary  Depression screening is the first step in getting the help that you may need.  If your screening test shows symptoms of depression (is positive), your health care provider may ask  you to see a mental health provider.  Anyone who is age 12 or older should be screened for depression. This information is not intended to replace advice given to you by your health care provider. Make sure you discuss any questions you have with your health care provider. Document Released: 08/01/2016 Document Revised: 08/01/2016 Document Reviewed: 08/01/2016 Elsevier Interactive Patient Education  2019 Elsevier Inc.  

## 2018-05-26 ENCOUNTER — Ambulatory Visit: Payer: Managed Care, Other (non HMO) | Admitting: Psychiatry

## 2018-05-26 NOTE — Progress Notes (Deleted)
BH MD OP Progress Note  05/26/2018 1:58 PM Nahima Lonni Thrasher  MRN:  [id number]  Chief Complaint:  HPI:  Richard is a 28 year old African-American female, employed, separated, lives in [place], has a history of depression, anxiety, presented to clinic today for a follow-up visit.      Visit Diagnosis: No diagnosis found.  Past Psychiatric History: Reviewed past psychiatric history from my progress note on 06/29/2017  Past Medical History:  Past Medical History:  Diagnosis Date  . Abnormal Pap smear of cervix   . Anemia   . Anxiety   . Chlamydia   . Depression   . Dysmenorrhea   . Heart murmur   . Inflammation of cervix     Past Surgical History:  Procedure Laterality Date  . LEEP    . TONSILLECTOMY AND ADENOIDECTOMY  2009  . tubes in ears    . WISDOM TOOTH EXTRACTION  2012    Family Psychiatric History: Reviewed family psychiatric history from my progress note on 06/29/2017  Family History:  Family History  Problem Relation Age of Onset  . Breast cancer Mother   . Hypertension Mother   . Alcohol abuse Father   . Breast cancer Maternal Grandmother   . Diabetes Maternal Grandmother   . Hyperlipidemia Maternal Grandmother   . Hypertension Maternal Grandmother   . Other Maternal Grandmother        fibroid uterus  . Bipolar disorder Sister   . Diabetes Sister        Borderline  . Anxiety disorder Sister   . Depression Sister   . Bipolar disorder Sister   . Anxiety disorder Sister   . Depression Sister   . Alcohol abuse Maternal Uncle   . Anxiety disorder Sister     Social History: Have reviewed social history from my progress note on 06/29/2017 Social History   Socioeconomic History  . Marital status: Married    Spouse name: brandon  . Number of children: 0  . Years of education: Not on file  . Highest education level: Associate degree: occupational, Scientist, product/process development, or vocational program  Occupational History    Comment: full time  Social Needs  .  Financial resource strain: Not hard at all  . Food insecurity:    Worry: Never true    Inability: Never true  . Transportation needs:    Medical: Yes    Non-medical: Yes  Tobacco Use  . Smoking status: Never Smoker  . Smokeless tobacco: Never Used  Substance and Sexual Activity  . Alcohol use: Yes    Alcohol/week: 0.0 - 1.0 standard drinks    Comment: Rarely  . Drug use: Yes    Types: Marijuana    Comment: last used 2 days ago   . Sexual activity: Yes    Partners: Male    Birth control/protection: None  Lifestyle  . Physical activity:    Days per week: 2 days    Minutes per session: 60 min  . Stress: Very much  Relationships  . Social connections:    Talks on phone: More than three times a week    Gets together: Never    Attends religious service: Never    Active member of club or organization: No    Attends meetings of clubs or organizations: Never    Relationship status: Married  Other Topics Concern  . Not on file  Social History Narrative  . Not on file    Allergies:  Allergies  Allergen Reactions  . Chocolate Anaphylaxis,  Hives and Other (See Comments)    Reaction:  Migraines and dizziness     Metabolic Disorder Labs: Lab Results  Component Value Date   HGBA1C 4.8 11/21/2016   Lab Results  Component Value Date   PROLACTIN 11.4 08/06/2012   Lab Results  Component Value Date   CHOL 109 11/21/2016   TRIG 50 11/21/2016   HDL 49 11/21/2016   LDLCALC 50 11/21/2016   Lab Results  Component Value Date   TSH 0.625 11/21/2016   TSH 0.913 12/10/2014    Therapeutic Level Labs: No results found for: LITHIUM No results found for: VALPROATE No components found for:  CBMZ  Current Medications: Current Outpatient Medications  Medication Sig Dispense Refill  . ARIPiprazole (ABILIFY) 5 MG tablet Take 1 tablet (5 mg total) by mouth daily. (Patient not taking: Reported on 05/19/2018) 90 tablet 0  . citalopram (CELEXA) 20 MG tablet Take 1.5 tablets (30 mg  total) by mouth daily. (Patient not taking: Reported on 05/19/2018) 135 tablet 0  . hydrOXYzine (ATARAX/VISTARIL) 10 MG tablet Take 1-2 tablets (10-20 mg total) by mouth 3 (three) times daily as needed. (Patient not taking: Reported on 05/19/2018) 180 tablet 1  . ondansetron (ZOFRAN-ODT) 4 MG disintegrating tablet Take 1 tablet (4 mg total) by mouth every 8 (eight) hours as needed for nausea or vomiting. 20 tablet 0  . traZODone (DESYREL) 50 MG tablet Take 1-2 tablets (50-100 mg total) by mouth at bedtime as needed for sleep. 180 tablet 1   No current facility-administered medications for this visit.      Musculoskeletal: Strength & Muscle Tone: {desc; muscle tone:32375} Gait & Station: {PE GAIT ED DUKG:25427} Patient leans: {Patient Leans:21022755}  Psychiatric Specialty Exam: ROS  There were no vitals taken for this visit.There is no height or weight on file to calculate BMI.  General Appearance: {Appearance:22683}  Eye Contact:  {BHH EYE CONTACT:22684}  Speech:  {Speech:22685}  Volume:  {Volume (PAA):22686}  Mood:  {BHH MOOD:22306}  Affect:  {Affect (PAA):22687}  Thought Process:  {Thought Process (PAA):22688}  Orientation:  {BHH ORIENTATION (PAA):22689}  Thought Content: {Thought Content:22690}   Suicidal Thoughts:  {ST/HT (PAA):22692}  Homicidal Thoughts:  {ST/HT (PAA):22692}  Memory:  {BHH MEMORY:22881}  Judgement:  {Judgement (PAA):22694}  Insight:  {Insight (PAA):22695}  Psychomotor Activity:  {Psychomotor (PAA):22696}  Concentration:  {Concentration:21399}  Recall:  {BHH GOOD/FAIR/POOR:22877}  Fund of Knowledge: {BHH GOOD/FAIR/POOR:22877}  Language: {BHH GOOD/FAIR/POOR:22877}  Akathisia:  {BHH YES OR NO:22294}  Handed:  {Handed:22697}  AIMS (if indicated): {Desc; done/not:10129}  Assets:  {Assets (PAA):22698}  ADL's:  {BHH CWC'B:76283}  Cognition: {chl bhh cognition:304700322}  Sleep:  {BHH GOOD/FAIR/POOR:22877}   Screenings: PHQ2-9     [facility] Visit from 06/02/2017  in [facility] Office Visit from 11/20/2016 in [facility]  PHQ-2 Total Score  3  4  PHQ-9 Total Score  13  15       Assessment and Plan:Iisha is a 28 yr old [place] American female who has a history of depression, anxiety, presented to clinic today for a follow-up visit.  Patient continues to improve.  Plan For depression Celexa 30 mg p.o. daily Abilify 5 mg p.o. daily Continue CBT  Anxiety Celexa 30 mg p.o. daily Hydroxyzine 10 to 20 mg p.o. 3 times daily as needed  For insomnia Trazodone 50 to 100 mg p.o. 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

## 2018-06-19 ENCOUNTER — Encounter (HOSPITAL_COMMUNITY): Payer: Self-pay | Admitting: Emergency Medicine

## 2018-06-19 ENCOUNTER — Emergency Department (HOSPITAL_COMMUNITY)
Admission: EM | Admit: 2018-06-19 | Discharge: 2018-06-19 | Disposition: A | Discharge status: Home / Self Care | Payer: 59 | Attending: Emergency Medicine | Admitting: Emergency Medicine

## 2018-06-19 ENCOUNTER — Other Ambulatory Visit: Payer: Self-pay

## 2018-06-19 DIAGNOSIS — Z79899 Other long term (current) drug therapy: Secondary | ICD-10-CM | POA: Diagnosis not present

## 2018-06-19 DIAGNOSIS — F419 Anxiety disorder, unspecified: Secondary | ICD-10-CM | POA: Diagnosis not present

## 2018-06-19 DIAGNOSIS — F331 Major depressive disorder, recurrent, moderate: Secondary | ICD-10-CM | POA: Insufficient documentation

## 2018-06-19 LAB — CBC WITH DIFFERENTIAL/PLATELET
Abs Immature Granulocytes: 0.03 10*3/uL (ref 0.00–0.07)
Basophils Absolute: 0.1 10*3/uL (ref 0.0–0.1)
Basophils Relative: 1 %
Eosinophils Absolute: 0.1 10*3/uL (ref 0.0–0.5)
Eosinophils Relative: 1 %
HCT: 41.7 % (ref 36.0–46.0)
HEMOGLOBIN: 13.4 g/dL (ref 12.0–15.0)
Immature Granulocytes: 0 %
Lymphocytes Relative: 36 %
Lymphs Abs: 3.1 10*3/uL (ref 0.7–4.0)
MCH: 28.9 pg (ref 26.0–34.0)
MCHC: 32.1 g/dL (ref 30.0–36.0)
MCV: 90.1 fL (ref 80.0–100.0)
Monocytes Absolute: 0.7 10*3/uL (ref 0.1–1.0)
Monocytes Relative: 9 %
Neutro Abs: 4.5 10*3/uL (ref 1.7–7.7)
Neutrophils Relative %: 53 %
Platelets: 276 10*3/uL (ref 150–400)
RBC: 4.63 MIL/uL (ref 3.87–5.11)
RDW: 12.1 % (ref 11.5–15.5)
WBC: 8.5 10*3/uL (ref 4.0–10.5)
nRBC: 0 % (ref 0.0–0.2)

## 2018-06-19 LAB — COMPREHENSIVE METABOLIC PANEL
ALT: 17 U/L (ref 0–44)
AST: 19 U/L (ref 15–41)
Albumin: 4.2 g/dL (ref 3.5–5.0)
Alkaline Phosphatase: 52 U/L (ref 38–126)
Anion gap: 11 (ref 5–15)
BUN: 7 mg/dL (ref 6–20)
CO2: 23 mmol/L (ref 22–32)
Calcium: 9.8 mg/dL (ref 8.9–10.3)
Chloride: 107 mmol/L (ref 98–111)
Creatinine, Ser: 0.61 mg/dL (ref 0.44–1.00)
GFR calc Af Amer: >60 mL/min (ref >60)
GFR calc non Af Amer: >60 mL/min (ref >60)
Glucose, Bld: 88 mg/dL (ref 70–99)
Potassium: 4.5 mmol/L (ref 3.5–5.1)
Sodium: 141 mmol/L (ref 135–145)
Total Bilirubin: 1.3 mg/dL — ABNORMAL HIGH (ref 0.3–1.2)
Total Protein: 7.8 g/dL (ref 6.5–8.1)

## 2018-06-19 LAB — ETHANOL: Alcohol, Ethyl (B): <10 mg/dL (ref <10)

## 2018-06-19 MED ORDER — HYDROXYZINE HCL 10 MG PO TABS: 10.0000 mg | ORAL_TABLET | Freq: Three times a day (TID) | ORAL | 1 refills | Status: DC | PRN

## 2018-06-19 NOTE — ED Triage Notes (Signed)
Pt to ED with c/o anxiety, pt tearful

## 2018-06-19 NOTE — Discharge Instructions (Addendum)
It was my pleasure taking care of you today!  Call Dr. Elvera Maria office to schedule an appointment.   Return to ER for new or worsening symptoms, any additional concerns.
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
## 2018-06-19 NOTE — Progress Notes (Signed)
Patient ID: Connie Guzman, female   DOB: [birth year], 29 y.o.   MRN: [id number]   Evaluated patient via telepsych. Alert and oriented x 4, pleasant, and cooperative. Speech is clear and coherent, normal volume and pace. Reports mood as anxious and affect is congruent with mood. Denies current SI. Reports last time that she has suicidal thoughts was January 2019. She has consistently denied SI since arrival at ED. Denies HI and AVH. No indication that she is responding to internal stimuli. No evidence that patient is of imminent risk to self or others. She feels safe returning home. Discussed reestablishing services with Dr. Elna Breslow. TTS provided with additional outpatient resources.  Recommend vistaril 25 mg TID prn.
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
## 2018-06-19 NOTE — ED Notes (Addendum)
Tele psych at bedside at this time 

## 2018-07-01 ENCOUNTER — Other Ambulatory Visit: Payer: Self-pay

## 2018-07-01 ENCOUNTER — Ambulatory Visit (INDEPENDENT_AMBULATORY_CARE_PROVIDER_SITE_OTHER): Payer: Managed Care, Other (non HMO) | Admitting: Psychiatry

## 2018-07-01 ENCOUNTER — Encounter: Payer: Self-pay | Admitting: Psychiatry

## 2018-07-01 DIAGNOSIS — F1210 Cannabis abuse, uncomplicated: Secondary | ICD-10-CM | POA: Diagnosis not present

## 2018-07-01 DIAGNOSIS — F331 Major depressive disorder, recurrent, moderate: Secondary | ICD-10-CM | POA: Diagnosis not present

## 2018-07-01 DIAGNOSIS — F411 Generalized anxiety disorder: Secondary | ICD-10-CM | POA: Diagnosis not present

## 2018-07-01 MED ORDER — ARIPIPRAZOLE 5 MG PO TABS: 5.0000 mg | ORAL_TABLET | Freq: Every day | ORAL | 0 refills | Status: DC

## 2018-07-01 MED ORDER — CITALOPRAM HYDROBROMIDE 20 MG PO TABS: 30.0000 mg | ORAL_TABLET | Freq: Every day | ORAL | 0 refills | Status: DC

## 2018-07-01 MED ORDER — TRAZODONE HCL 50 MG PO TABS: 50.0000 mg | ORAL_TABLET | Freq: Every evening | ORAL | 0 refills | Status: DC | PRN

## 2018-07-01 MED ORDER — HYDROXYZINE HCL 25 MG PO TABS: 25.0000 mg | ORAL_TABLET | Freq: Three times a day (TID) | ORAL | 2 refills | Status: AC | PRN

## 2018-07-01 NOTE — Progress Notes (Signed)
Virtual Visit via Video Note  I connected with Connie Guzman on 07/01/18 at  9:30 AM EDT by a video enabled telemedicine application and verified that I am speaking with the correct person using two identifiers.   I discussed the limitations of evaluation and management by telemedicine and the availability of in person appointments. The patient expressed understanding and agreed to proceed.  I discussed the assessment and treatment plan with the patient. The patient was provided an opportunity to ask questions and all were answered. The patient agreed with the plan and demonstrated an understanding of the instructions.   The patient was advised to call back or seek an in-person evaluation if the symptoms worsen or if the condition fails to improve as anticipated.  I provided 15 minutes of non-face-to-face time during this encounter.   Jomarie Longs, MD  BH MD OP Progress Note  07/01/2018 9:48 AM Connie Guzman  MRN:  [id number]  Chief Complaint:  Chief Complaint    Follow-up     HPI: Connie Guzman is a 28 year-old African-American female, employed, separated currently lives in [place], has a history of depression, anxiety was evaluated by video consult.  Patient was last seen in clinic on 01/05/2018.  Patient has been noncompliant with her follow-up visits as well as treatment recommendations.  Patient today reports she was going through a lot of psychosocial stressors, did not have transportation to come for appointments.  She reports she would like to get back on her medications at this time.  Patient reports she struggles with sadness, low energy as well as anxiety symptoms like racing heart rate, nervousness and so on.  Patient reports she was seen recently in the emergency department on 06/19/2018.  I reviewed medical records in [facility] per Dr. Azalia Bilis dated 06/19/2018-ED visit-' patient seen for panic attacks.  Patient stopped all her medications since she thought  she did not need it anymore.  Patient advised to follow-up with outpatient provider.  Patient was discharged on hydroxyzine as needed.'  Patient today reports she did not fill the hydroxyzine which was given to her from the emergency department.  She wanted to wait for this appointment.  She reports she was doing well on the Celexa and the Abilify.  She would like to get back on those medications.  Patient denies any suicidality, homicidality or perceptual disturbances.  Patient continues to struggle with sleep.  She continues to work night shifts.  Patient reports she would like to get back on her trazodone.  Discussed sleep hygiene techniques.  Patient has restarted using cannabis again.  Discussed with her to limit use, provided substance abuse counseling.  Patient would like [place] to fill FMLA, discussed with her to sent it to our office.   Visit Diagnosis:    ICD-10-CM   1. MDD (major depressive disorder), recurrent episode, moderate (HCC) F33.1 traZODone (DESYREL) 50 MG tablet    hydrOXYzine (ATARAX/VISTARIL) 25 MG tablet    citalopram (CELEXA) 20 MG tablet    ARIPiprazole (ABILIFY) 5 MG tablet  2. GAD (generalized anxiety disorder) F41.1 traZODone (DESYREL) 50 MG tablet    hydrOXYzine (ATARAX/VISTARIL) 25 MG tablet    citalopram (CELEXA) 20 MG tablet    ARIPiprazole (ABILIFY) 5 MG tablet  3. Cannabis use disorder, mild, abuse F12.10     Past Psychiatric History: I have reviewed past psychiatric history from my progress note on 06/29/2017.  Past Medical History:  Past Medical History:  Diagnosis Date  . Abnormal Pap smear  of cervix   . Anemia   . Anxiety   . Chlamydia   . Depression   . Dysmenorrhea   . Heart murmur   . Inflammation of cervix     Past Surgical History:  Procedure Laterality Date  . LEEP    . TONSILLECTOMY AND ADENOIDECTOMY  2009  . tubes in ears    . WISDOM TOOTH EXTRACTION  2012    Family Psychiatric History: I have reviewed family psychiatric history  from my progress note on 06/29/2017.  Family History:  Family History  Problem Relation Age of Onset  . Breast cancer Mother   . Hypertension Mother   . Alcohol abuse Father   . Breast cancer Maternal Grandmother   . Diabetes Maternal Grandmother   . Hyperlipidemia Maternal Grandmother   . Hypertension Maternal Grandmother   . Other Maternal Grandmother        fibroid uterus  . Bipolar disorder Sister   . Diabetes Sister        Borderline  . Anxiety disorder Sister   . Depression Sister   . Bipolar disorder Sister   . Anxiety disorder Sister   . Depression Sister   . Alcohol abuse Maternal Uncle   . Anxiety disorder Sister     Social History: I have reviewed social history from my progress note on 06/29/2017.  Currently lives in [place] with her friend. Social History   Socioeconomic History  . Marital status: Married    Spouse name: brandon  . Number of children: 0  . Years of education: Not on file  . Highest education level: Associate degree: occupational, Scientist, product/process development, or vocational program  Occupational History    Comment: full time  Social Needs  . Financial resource strain: Not hard at all  . Food insecurity:    Worry: Never true    Inability: Never true  . Transportation needs:    Medical: Yes    Non-medical: Yes  Tobacco Use  . Smoking status: Never Smoker  . Smokeless tobacco: Never Used  Substance and Sexual Activity  . Alcohol use: Yes    Alcohol/week: 0.0 - 1.0 standard drinks    Comment: Rarely  . Drug use: Yes    Types: Marijuana    Comment: last used 2 days ago   . Sexual activity: Yes    Partners: Male    Birth control/protection: None  Lifestyle  . Physical activity:    Days per week: 2 days    Minutes per session: 60 min  . Stress: Very much  Relationships  . Social connections:    Talks on phone: More than three times a week    Gets together: Never    Attends religious service: Never    Active member of club or organization: No     Attends meetings of clubs or organizations: Never    Relationship status: Married  Other Topics Concern  . Not on file  Social History Narrative  . Not on file    Allergies:  Allergies  Allergen Reactions  . Chocolate Anaphylaxis, Hives and Other (See Comments)    Migraines and dizziness     Metabolic Disorder Labs: Lab Results  Component Value Date   HGBA1C 4.8 11/21/2016   Lab Results  Component Value Date   PROLACTIN 11.4 08/06/2012   Lab Results  Component Value Date   CHOL 109 11/21/2016   TRIG 50 11/21/2016   HDL 49 11/21/2016   LDLCALC 50 11/21/2016   Lab Results  Component Value Date   TSH 0.625 11/21/2016   TSH 0.913 12/10/2014    Therapeutic Level Labs: No results found for: LITHIUM No results found for: VALPROATE No components found for:  CBMZ  Current Medications: Current Outpatient Medications  Medication Sig Dispense Refill  . acetaminophen (TYLENOL) 500 MG tablet Take 500-1,000 mg by mouth every 6 (six) hours as needed (for pain or headaches).     . ARIPiprazole (ABILIFY) 5 MG tablet Take 1 tablet (5 mg total) by mouth daily. 90 tablet 0  . aspirin-acetaminophen-caffeine (EXCEDRIN MIGRAINE) 250-250-65 MG tablet Take 2 tablets by mouth every 6 (six) hours as needed for headache or migraine.    . citalopram (CELEXA) 20 MG tablet Take 1.5 tablets (30 mg total) by mouth daily. 135 tablet 0  . fexofenadine (ALLEGRA) 180 MG tablet Take 180 mg by mouth daily as needed for allergies or rhinitis.    . fluticasone (FLONASE) 50 MCG/ACT nasal spray Place 2 sprays into both nostrils daily as needed for allergies or rhinitis.    . hydrOXYzine (ATARAX/VISTARIL) 25 MG tablet Take 1 tablet (25 mg total) by mouth 3 (three) times daily as needed. 90 tablet 2  . ondansetron (ZOFRAN-ODT) 4 MG disintegrating tablet Take 1 tablet (4 mg total) by mouth every 8 (eight) hours as needed for nausea or vomiting. (Patient not taking: Reported on 06/19/2018) 20 tablet 0  .  traZODone (DESYREL) 50 MG tablet Take 1-2 tablets (50-100 mg total) by mouth at bedtime as needed for sleep. 180 tablet 0   No current facility-administered medications for this visit.      Musculoskeletal: Strength & Muscle Tone: within normal limits Gait & Station: UTA Patient leans: N/A  Psychiatric Specialty Exam: Review of Systems  Psychiatric/Behavioral: The patient is nervous/anxious.   All other systems reviewed and are negative.   Last menstrual period 06/11/2018.There is no height or weight on file to calculate BMI.  General Appearance: Casual  Eye Contact:  Fair  Speech:  Normal Rate  Volume:  Normal  Mood:  Anxious and Depressed  Affect:  Congruent  Thought Process:  Goal Directed and Descriptions of Associations: Intact  Orientation:  Full (Time, Place, and Person)  Thought Content: Logical   Suicidal Thoughts:  No  Homicidal Thoughts:  No  Memory:  Immediate;   Fair Recent;   Fair Remote;   Fair  Judgement:  Fair  Insight:  Fair  Psychomotor Activity:  Normal  Concentration:  Concentration: Fair and Attention Span: Fair  Recall:  [employer] of Knowledge: Fair  Language: Fair  Akathisia:  No  Handed:  Right  AIMS (if indicated): Denies tremors, rigidity, stiffness  Assets:  Communication Skills Desire for Improvement Social Support  ADL's:  Intact  Cognition: WNL  Sleep:  Poor   Screenings: PHQ2-9     Office Visit from 06/02/2017 in [facility] Office Visit from 11/20/2016 in [facility]  PHQ-2 Total Score  3  4  PHQ-9 Total Score  13  15       Assessment and Plan: Tashiana is a 28 year old African-American female who has a history of depression, anxiety, cannabis use disorder was evaluated by telemedicine.  Patient was noncompliant with follow-up visits as well as medication regimen.  Patient hence continues to struggle with mood symptoms and sleep problems.  She wants to restart medications and hence discussed the  following plan as discussed below.  Plan For depression-unstable Restart Celexa 30 mg p.o. daily Restart Abilify 5 mg  p.o. daily Discussed with patient to reschedule appointment with Ms. Peacock for CBT.   For anxiety disorder-unstable Celexa 30 mg p.o. daily Hydroxyzine restarted at 25 mg p.o. 3 times daily as needed .  For insomnia-unstable Restart trazodone 50 to 100 mg p.o. nightly as needed  For cannabis use disorder mild-unstable Provided substance abuse counseling.  I have reviewed medical records in [facility] per Dr. Caryn Bee Campos-dated 06/19/2018-ED progress notes as summarized above.  Follow-up in clinic in 1 month or sooner if needed.  I have spent atleast 15 minutes non face to face with patient today. More than 50 % of the time was spent for psychoeducation and supportive psychotherapy and care coordination.  This note was generated in part or whole with voice recognition software. Voice recognition is usually quite accurate but there are transcription errors that can and very often do occur. I apologize for any typographical errors that were not detected and corrected.         Jomarie Longs, MD 07/01/2018, 9:48 AM

## 2018-07-01 NOTE — Progress Notes (Signed)
Tc on 07-01-18. Pt medical and surgical hx was reviewed with no changes.  Medication and pharmacy was review and updated. Pt vital could not be taken due to this visit is a phone/web visit.

## 2018-07-05 ENCOUNTER — Telehealth: Payer: Self-pay | Admitting: Psychiatry

## 2018-07-05 NOTE — Telephone Encounter (Signed)
Just got a hold of her she at her phone now.

## 2018-07-05 NOTE — Telephone Encounter (Signed)
Attempted to call patient again today.  Mailbox is full.  Had questions prior to filling out her FMLA.  Unable to leave message.  We will sent an message to Ms. Juliette Mangle, CMA to try to contact her.

## 2018-07-06 ENCOUNTER — Telehealth: Payer: Self-pay | Admitting: Psychiatry

## 2018-07-06 NOTE — Telephone Encounter (Signed)
Called patient , completed FMLA, will give to Shanda Bumps CMA to fax it to patient after scanning it to record.

## 2018-07-12 ENCOUNTER — Ambulatory Visit: Payer: Managed Care, Other (non HMO) | Admitting: Licensed Clinical Social Worker

## 2018-07-12 ENCOUNTER — Ambulatory Visit (INDEPENDENT_AMBULATORY_CARE_PROVIDER_SITE_OTHER): Payer: Managed Care, Other (non HMO) | Admitting: Licensed Clinical Social Worker

## 2018-07-12 ENCOUNTER — Other Ambulatory Visit: Payer: Self-pay

## 2018-07-12 DIAGNOSIS — F331 Major depressive disorder, recurrent, moderate: Secondary | ICD-10-CM

## 2018-07-12 DIAGNOSIS — F411 Generalized anxiety disorder: Secondary | ICD-10-CM | POA: Diagnosis not present

## 2018-07-19 ENCOUNTER — Ambulatory Visit (INDEPENDENT_AMBULATORY_CARE_PROVIDER_SITE_OTHER): Payer: Managed Care, Other (non HMO) | Admitting: Licensed Clinical Social Worker

## 2018-07-19 ENCOUNTER — Other Ambulatory Visit: Payer: Self-pay

## 2018-07-19 DIAGNOSIS — F411 Generalized anxiety disorder: Secondary | ICD-10-CM

## 2018-07-19 DIAGNOSIS — F331 Major depressive disorder, recurrent, moderate: Secondary | ICD-10-CM

## 2018-07-20 NOTE — Progress Notes (Signed)
Virtual Visit via Telephone Note  I connected with Connie Guzman on 07/19/18 at  8:00 AM EDT by telephone and verified that I am speaking with the correct person using two identifiers.   I discussed the limitations, risks, security and privacy concerns of performing an evaluation and management service by telephone and the availability of in person appointments. I also discussed with the patient that there may be a patient responsible charge related to this service. The patient expressed understanding and agreed to proceed.   History of Present Illness: Patient continues to struggle with depression, recurrent, severe.  Denies SI/HI    Observations/Objective: Patient lives with friends, car repossessed and has transportation difficulty.  Works 2 jobs and has difficulty with symptoms.   Assessment and Plan: Continue to use coping skills to reduce symptoms   Follow Up Instructions: Follow up session July 26 2018 at 8a    I discussed the assessment and treatment plan with the patient. The patient was provided an opportunity to ask questions and all were answered. The patient agreed with the plan and demonstrated an understanding of the instructions.   The patient was advised to call back or seek an in-person evaluation if the symptoms worsen or if the condition fails to improve as anticipated.  I provided 35 minutes of non-face-to-face time during this encounter.   Marinda Elk, LCSW

## 2018-07-26 ENCOUNTER — Other Ambulatory Visit: Payer: Self-pay

## 2018-07-26 ENCOUNTER — Ambulatory Visit (INDEPENDENT_AMBULATORY_CARE_PROVIDER_SITE_OTHER): Payer: Managed Care, Other (non HMO) | Admitting: Licensed Clinical Social Worker

## 2018-07-26 DIAGNOSIS — F411 Generalized anxiety disorder: Secondary | ICD-10-CM

## 2018-07-26 DIAGNOSIS — F331 Major depressive disorder, recurrent, moderate: Secondary | ICD-10-CM | POA: Diagnosis not present

## 2018-08-02 ENCOUNTER — Ambulatory Visit (INDEPENDENT_AMBULATORY_CARE_PROVIDER_SITE_OTHER): Payer: Managed Care, Other (non HMO) | Admitting: Licensed Clinical Social Worker

## 2018-08-02 ENCOUNTER — Other Ambulatory Visit: Payer: Self-pay

## 2018-08-02 DIAGNOSIS — F331 Major depressive disorder, recurrent, moderate: Secondary | ICD-10-CM

## 2018-08-05 ENCOUNTER — Ambulatory Visit (INDEPENDENT_AMBULATORY_CARE_PROVIDER_SITE_OTHER): Payer: Managed Care, Other (non HMO) | Admitting: Psychiatry

## 2018-08-05 ENCOUNTER — Encounter: Payer: Self-pay | Admitting: Psychiatry

## 2018-08-05 ENCOUNTER — Other Ambulatory Visit: Payer: Self-pay

## 2018-08-05 DIAGNOSIS — F1210 Cannabis abuse, uncomplicated: Secondary | ICD-10-CM | POA: Diagnosis not present

## 2018-08-05 DIAGNOSIS — F411 Generalized anxiety disorder: Secondary | ICD-10-CM

## 2018-08-05 DIAGNOSIS — F3181 Bipolar II disorder: Secondary | ICD-10-CM

## 2018-08-05 MED ORDER — CITALOPRAM HYDROBROMIDE 20 MG PO TABS: 10.0000 mg | ORAL_TABLET | Freq: Every day | ORAL | 0 refills | Status: DC

## 2018-08-05 NOTE — Progress Notes (Signed)
Virtual Visit via Telephone Note  I connected with Connie Guzman Going on 08/05/18 at  8:00 AM EDT by telephone and verified that I am speaking with the correct person using two identifiers.  Attempted to make contact with Patient on this day unsuccessful

## 2018-08-05 NOTE — Progress Notes (Addendum)
Virtual Visit via Video Note  I connected with Connie Guzman on 08/05/18 at 11:30 AM EDT by a video enabled telemedicine application and verified that I am speaking with the correct person using two identifiers.   I discussed the limitations of evaluation and management by telemedicine and the availability of in person appointments. The patient expressed understanding and agreed to proceed.     I discussed the assessment and treatment plan with the patient. The patient was provided an opportunity to ask questions and all were answered. The patient agreed with the plan and demonstrated an understanding of the instructions.   The patient was advised to call back or seek an in-person evaluation if the symptoms worsen or if the condition fails to improve as anticipated.   BH MD OP Progress Note  08/05/2018 1:23 PM Deziya Verdene LennertCaprice Guida  MRN:  [id number]  Chief Complaint:  Chief Complaint    Follow-up     HPI: Connie Guzman is a 28 year old African-American female, employed, separated, currently lives in [place], has a history of depression, anxiety was evaluated by telemedicine today.  Patient reports she could not tolerate the higher dosage of Celexa.  She reports she passed out at her workplace after taking Celexa 30 mg.  She reports she currently takes only 10 mg.  She reports so far she has been doing okay.  She however reports most some recent hypomanic symptoms.  She reports she may have been struggling with this for a while however has not quite noticed it yet.  She reports one of her coworkers also brought her attention to it.  She does report episodes of being hyperactive, restless, increased energy, risk-taking behavior, decreased need for sleep, being too talkative, spending money that she does not have and so on.  She reports bipolar disorder runs in her family.  She reports her mother and 2 sisters have a diagnosis of bipolar disorder.  Patient was advised to complete a mood  disorder questionnaire.  Discussed with patient that she does meet criteria for bipolar type II and most likely is going through a hypomanic phase.  Patient reports she currently has social support from her boyfriend.  She started dating someone and that is going well.  Patient reports work is going well.  She reports she takes trazodone 50 mg at bedtime and that has been helpful with her sleep.  Patient reports she is staying away from cannabis.  She denies any other concerns today. Visit Diagnosis:    ICD-10-CM   1. Bipolar 2 disorder (HCC) F31.81    hypomanic episodes  2. GAD (generalized anxiety disorder) F41.1 citalopram (CELEXA) 20 MG tablet  3. Cannabis use disorder, mild, abuse F12.10     Past Psychiatric History: Reviewed past psychiatric history from my progress note on 06/29/2017.  Past Medical History:  Past Medical History:  Diagnosis Date  . Abnormal Pap smear of cervix   . Anemia   . Anxiety   . Chlamydia   . Depression   . Dysmenorrhea   . Heart murmur   . Inflammation of cervix     Past Surgical History:  Procedure Laterality Date  . LEEP    . TONSILLECTOMY AND ADENOIDECTOMY  2009  . tubes in ears    . WISDOM TOOTH EXTRACTION  2012    Family Psychiatric History: Reviewed family psychiatric history from my progress note on 06/29/2017.  Family History:  Family History  Problem Relation Age of Onset  . Breast cancer Mother   .  Hypertension Mother   . Alcohol abuse Father   . Breast cancer Maternal Grandmother   . Diabetes Maternal Grandmother   . Hyperlipidemia Maternal Grandmother   . Hypertension Maternal Grandmother   . Other Maternal Grandmother        fibroid uterus  . Bipolar disorder Sister   . Diabetes Sister        Borderline  . Anxiety disorder Sister   . Depression Sister   . Bipolar disorder Sister   . Anxiety disorder Sister   . Depression Sister   . Alcohol abuse Maternal Uncle   . Anxiety disorder Sister     Social History:  Reviewed social history from my progress note on 06/29/2017 Social History   Socioeconomic History  . Marital status: Married    Spouse name: brandon  . Number of children: 0  . Years of education: Not on file  . Highest education level: Associate degree: occupational, Scientist, product/process development, or vocational program  Occupational History    Comment: full time  Social Needs  . Financial resource strain: Somewhat hard  . Food insecurity:    Worry: Often true    Inability: Often true  . Transportation needs:    Medical: Yes    Non-medical: Yes  Tobacco Use  . Smoking status: Never Smoker  . Smokeless tobacco: Never Used  Substance and Sexual Activity  . Alcohol use: Yes    Alcohol/week: 0.0 - 1.0 standard drinks    Comment: Rarely  . Drug use: Yes    Types: Marijuana    Comment: last used 2 days ago   . Sexual activity: Yes    Partners: Male    Birth control/protection: None  Lifestyle  . Physical activity:    Days per week: 2 days    Minutes per session: 60 min  . Stress: Very much  Relationships  . Social connections:    Talks on phone: More than three times a week    Gets together: Never    Attends religious service: Never    Active member of club or organization: No    Attends meetings of clubs or organizations: Never    Relationship status: Married  Other Topics Concern  . Not on file  Social History Narrative  . Not on file    Allergies:  Allergies  Allergen Reactions  . Chocolate Anaphylaxis, Hives and Other (See Comments)    Migraines and dizziness     Metabolic Disorder Labs: Lab Results  Component Value Date   HGBA1C 4.8 11/21/2016   Lab Results  Component Value Date   PROLACTIN 11.4 08/06/2012   Lab Results  Component Value Date   CHOL 109 11/21/2016   TRIG 50 11/21/2016   HDL 49 11/21/2016   LDLCALC 50 11/21/2016   Lab Results  Component Value Date   TSH 0.625 11/21/2016   TSH 0.913 12/10/2014    Therapeutic Level Labs: No results found for:  LITHIUM No results found for: VALPROATE No components found for:  CBMZ  Current Medications: Current Outpatient Medications  Medication Sig Dispense Refill  . acetaminophen (TYLENOL) 500 MG tablet Take 500-1,000 mg by mouth every 6 (six) hours as needed (for pain or headaches).     . ARIPiprazole (ABILIFY) 5 MG tablet Take 1 tablet (5 mg total) by mouth daily. 90 tablet 0  . aspirin-acetaminophen-caffeine (EXCEDRIN MIGRAINE) 250-250-65 MG tablet Take 2 tablets by mouth every 6 (six) hours as needed for headache or migraine.    . citalopram (CELEXA) 20  MG tablet Take 0.5 tablets (10 mg total) by mouth daily. 90 tablet 0  . fexofenadine (ALLEGRA) 180 MG tablet Take 180 mg by mouth daily as needed for allergies or rhinitis.    . fluticasone (FLONASE) 50 MCG/ACT nasal spray Place 2 sprays into both nostrils daily as needed for allergies or rhinitis.    . hydrOXYzine (ATARAX/VISTARIL) 25 MG tablet Take 1 tablet (25 mg total) by mouth 3 (three) times daily as needed. 90 tablet 2  . ondansetron (ZOFRAN-ODT) 4 MG disintegrating tablet Take 1 tablet (4 mg total) by mouth every 8 (eight) hours as needed for nausea or vomiting. 20 tablet 0  . traZODone (DESYREL) 50 MG tablet Take 1-2 tablets (50-100 mg total) by mouth at bedtime as needed for sleep. 180 tablet 0   No current facility-administered medications for this visit.      Musculoskeletal: Strength & Muscle Tone: within normal limits Gait & Station: normal Patient leans: N/A  Psychiatric Specialty Exam: Review of Systems  Psychiatric/Behavioral: The patient is nervous/anxious.   All other systems reviewed and are negative.   There were no vitals taken for this visit.There is no height or weight on file to calculate BMI.  General Appearance: Casual  Eye Contact:  Fair  Speech:  Normal Rate  Volume:  Normal  Mood:  Anxious  Affect:  Congruent  Thought Process:  Goal Directed and Descriptions of Associations: Intact  Orientation:  Full  (Time, Place, and Person)  Thought Content: Logical   Suicidal Thoughts:  No  Homicidal Thoughts:  No  Memory:  Immediate;   Fair Recent;   Fair Remote;   Fair  Judgement:  Fair  Insight:  Fair  Psychomotor Activity:  Normal  Concentration:  Concentration: Fair and Attention Span: Fair  Recall:  [employer] of Knowledge: Fair  Language: Fair  Akathisia:  No  Handed:  Right  AIMS (if indicated): denies tremors, rigidity  Assets:  Communication Skills Desire for Improvement Social Support  ADL's:  Intact  Cognition: WNL  Sleep:  Fair   Screenings: PHQ2-9     Office Visit from 06/02/2017 in [facility] Office Visit from 11/20/2016 in [facility]  PHQ-2 Total Score  3  4  PHQ-9 Total Score  13  15       Assessment and Plan: Connie Guzman is a 28 year old African-American female who has a history of depression, anxiety, cannabis use disorder and early remission was evaluated by telemedicine today.  Patient completed a mood disorder questionnaire and likely meets criteria for bipolar type II.  Patient will continue to benefit from medications however is currently doing well on the Abilify and Celexa.  We will continue plan as noted below.  Plan For bipolar disorder type II- some improvement Continue Abilify 5 mg p.o. daily.  Patient is not interested in a dosage increase today. Continue Celexa 10 mg p.o. daily Continue CBT with Ms. Peacock.  For anxiety disorder- improving Hydroxyzine as prescribed Celexa 10 mg p.o. daily  For insomnia-stable Trazodone 50 mg p.o. nightly as needed  For cannabis use disorder mild-in early remission Provided substance abuse counseling.  Follow-up in clinic in 4 weeks or sooner if needed.  I have spent atleast 15 minutes non face to face with patient today. More than 50 % of the time was spent for psychoeducation and supportive psychotherapy and care coordination.  This note was generated in part or whole with voice  recognition software. Voice recognition is usually quite accurate but  there are transcription errors that can and very often do occur. I apologize for any typographical errors that were not detected and corrected.        Jomarie Longs, MD 08/05/2018, 1:23 PM

## 2018-08-09 ENCOUNTER — Other Ambulatory Visit: Payer: Self-pay

## 2018-08-09 ENCOUNTER — Ambulatory Visit (INDEPENDENT_AMBULATORY_CARE_PROVIDER_SITE_OTHER): Payer: Managed Care, Other (non HMO) | Admitting: Licensed Clinical Social Worker

## 2018-08-09 DIAGNOSIS — F3181 Bipolar II disorder: Secondary | ICD-10-CM

## 2018-08-15 NOTE — Progress Notes (Signed)
Virtual Visit via Telephone Note  I connected with Javonna Caprice Savidge on 07/26/18 at  8:00 AM EDT by telephone and verified that I am speaking with the correct person using two identifiers.  Location: Patient: Home Provider: [facility]   I discussed the limitations, risks, security and privacy concerns of performing an evaluation and management service by telephone and the availability of in person appointments. I also discussed with the patient that there may be a patient responsible charge related to this service. The patient expressed understanding and agreed to proceed.   History of Present Illness: Anxiety & Depression    Observations/Objective:  Patient reports an increase in anxiety the past 2 days.  Triggers unknown.  Discussion of coping skills and being able to understand her needs and wants   Assessment and Plan: continue to use coping skills.   Follow Up Instructions: 1 week    I discussed the assessment and treatment plan with the patient. The patient was provided an opportunity to ask questions and all were answered. The patient agreed with the plan and demonstrated an understanding of the instructions.   The patient was advised to call back or seek an in-person evaluation if the symptoms worsen or if the condition fails to improve as anticipated.  I provided 60 minutes of non-face-to-face time during this encounter.   Marinda Elk, LCSW

## 2018-08-16 ENCOUNTER — Ambulatory Visit (INDEPENDENT_AMBULATORY_CARE_PROVIDER_SITE_OTHER): Payer: Managed Care, Other (non HMO) | Admitting: Licensed Clinical Social Worker

## 2018-08-16 ENCOUNTER — Other Ambulatory Visit: Payer: Self-pay

## 2018-08-16 DIAGNOSIS — F3181 Bipolar II disorder: Secondary | ICD-10-CM

## 2018-08-17 NOTE — Progress Notes (Signed)
No contact.

## 2018-08-17 NOTE — Progress Notes (Signed)
Virtual Visit via Telephone Note  I connected with Connie Guzman on 08/17/18 at  8:00 AM EDT by a video enabled telemedicine application and verified that I am speaking with the correct person using two identifiers.  Location: Patient: Home Provider: [facility]   I discussed the limitations of evaluation and management by telemedicine and the availability of in person appointments. The patient expressed understanding and agreed to proceed.   Treatment Goals addressed: Anxiety and Coping  Interventions: CBT and Motivational Interviewing  Summary: Connie Guzman is a 28 y.o. female who presents with continued symptoms.  Therapist discussed Practical Facts about Depression with Patient.  Therapist educated Patient on What is Depression and how it is diagnosed.  Reviewed with her Symptoms of depression and her current and past symptoms. Therapist assisted Patient with completing an exercise on "symptoms of depression" in the Illness Management and Recovery workbook.  Discussion of when her symptoms seem to present themselves and the importance of having a journal to record information to learn about triggers.  Therapist normalized Patient's thoughts about depression by discussion famous people who have been diagnosed with depression.  Therapist ended the session with a discussion on stigma.  Therapist encouraged Patient to journal and to praise herself for the positives.    Suicidal/Homicidal: No  Plan: Return again in 1 weeks.   I provided 60 minutes of non-face-to-face time during this encounter.  Marinda Elk, LCSW

## 2018-08-26 NOTE — Progress Notes (Signed)
Virtual Visit via Telephone Note  I connected with Connie Guzman on 08/09/18 at  8:00 AM EDT by telephone and verified that I am speaking with the correct person using two identifiers.  Location: Patient: home Provider: office   I discussed the limitations, risks, security and privacy concerns of performing an evaluation and management service by telephone and the availability of in person appointments. I also discussed with the patient that there may be a patient responsible charge related to this service. The patient expressed understanding and agreed to proceed.        Participation Level: Active  Type of Therapy: Individual Therapy  Treatment Goals addressed: Coping  Interventions: CBT and Motivational Interviewing  Summary: Connie Guzman is a 28 y.o. female who presents with continued symptoms.  Therapist discussed Practical Facts about Depression with Patient.  Therapist educated Patient on What is Depression and how it is diagnosed.  Reviewed with her Symptoms of depression and her current and past symptoms. Therapist assisted Patient with completing an exercise on "symptoms of depression" in the Illness Management and Recovery workbook.  Discussion of when her symptoms seem to present themselves and the importance of having a journal to record information to learn about triggers.  Therapist normalized Patient's thoughts about depression by discussion famous people who have been diagnosed with depression.  Therapist ended the session with a discussion on stigma.  Therapist encouraged Patient to journal and to praise herself for the positives.    Suicidal/Homicidal: No  Plan: Return again in 2 weeks.  Diagnosis: Axis I: Bipolar 2    Axis II: No diagnosis    I discussed the assessment and treatment plan with the patient. The patient was provided an opportunity to ask questions and all were answered. The patient agreed with the plan and demonstrated an  understanding of the instructions.   The patient was advised to call back or seek an in-person evaluation if the symptoms worsen or if the condition fails to improve as anticipated.  I provided 60 minutes of non-face-to-face time during this encounter.   Marinda Elk, LCSW

## 2018-08-30 ENCOUNTER — Other Ambulatory Visit: Payer: Self-pay

## 2018-08-30 ENCOUNTER — Ambulatory Visit (INDEPENDENT_AMBULATORY_CARE_PROVIDER_SITE_OTHER): Payer: Managed Care, Other (non HMO) | Admitting: Licensed Clinical Social Worker

## 2018-08-30 DIAGNOSIS — F3181 Bipolar II disorder: Secondary | ICD-10-CM | POA: Diagnosis not present

## 2018-09-02 NOTE — Progress Notes (Signed)
Virtual Visit via Telephone Note  I connected with Connie Guzman on 08/30/18 at  9:00 AM EDT by telephone and verified that I am speaking with the correct person using two identifiers.  Location: Patient: home Provider: office   I discussed the limitations, risks, security and privacy concerns of performing an evaluation and management service by telephone and the availability of in person appointments. I also discussed with the patient that there may be a patient responsible charge related to this service. The patient expressed understanding and agreed to proceed.    Participation Level: Active  Type of Therapy: Individual Therapy  Treatment Goals addressed: Coping  Interventions: CBT and Motivational Interviewing  Summary: Connie Guzman is a 28 y.o. female who presents with continued symptoms of diagnosis.  Therapist discussed Practical Facts about Bipolar with Patient.  Therapist educated Patient on What is Bipolar and how it is diagnosed.  Reviewed with her Symptoms of Bipolar and her current and past symptoms. Therapist assisted Patient with completing an exercise on "symptoms of Bipolar" in the Illness Management and Recovery workbook.  Discussion of when her symptoms seem to present themselves and the importance of having a journal to record information to learn about triggers.  Therapist normalized Patient's thoughts about Bipolar by discussion famous people who have been diagnosed with Bipolar.  Therapist ended the session with a discussion on stigma.  Therapist encouraged Patient to journal and to praise herself for the positives.    Suicidal/Homicidal: No  Plan: Return again in 1 weeks.  Diagnosis: Axis I: Bipolar 2    Axis II: No diagnosis      I discussed the assessment and treatment plan with the patient. The patient was provided an opportunity to ask questions and all were answered. The patient agreed with the plan and demonstrated an understanding of  the instructions.   The patient was advised to call back or seek an in-person evaluation if the symptoms worsen or if the condition fails to improve as anticipated.  I provided 60 minutes of non-face-to-face time during this encounter.   Marinda Elk, LCSW

## 2018-09-06 ENCOUNTER — Other Ambulatory Visit: Payer: Self-pay

## 2018-09-06 ENCOUNTER — Ambulatory Visit: Payer: Managed Care, Other (non HMO) | Admitting: Licensed Clinical Social Worker

## 2018-09-08 ENCOUNTER — Encounter: Payer: Self-pay | Admitting: Psychiatry

## 2018-09-08 ENCOUNTER — Ambulatory Visit (INDEPENDENT_AMBULATORY_CARE_PROVIDER_SITE_OTHER): Payer: Managed Care, Other (non HMO) | Admitting: Psychiatry

## 2018-09-08 ENCOUNTER — Other Ambulatory Visit: Payer: Self-pay

## 2018-09-08 DIAGNOSIS — F1211 Cannabis abuse, in remission: Secondary | ICD-10-CM | POA: Diagnosis not present

## 2018-09-08 DIAGNOSIS — F3181 Bipolar II disorder: Secondary | ICD-10-CM | POA: Diagnosis not present

## 2018-09-08 DIAGNOSIS — F411 Generalized anxiety disorder: Secondary | ICD-10-CM | POA: Diagnosis not present

## 2018-09-08 MED ORDER — ARIPIPRAZOLE 10 MG PO TABS: 10.0000 mg | ORAL_TABLET | Freq: Every day | ORAL | 1 refills | Status: DC

## 2018-09-08 NOTE — Progress Notes (Signed)
Virtual Visit via Video Note  I connected with Connie Guzman on 09/08/18 at 10:00 AM EDT by a video enabled telemedicine application and verified that I am speaking with the correct person using two identifiers.   I discussed the limitations of evaluation and management by telemedicine and the availability of in person appointments. The patient expressed understanding and agreed to proceed.   I discussed the assessment and treatment plan with the patient. The patient was provided an opportunity to ask questions and all were answered. The patient agreed with the plan and demonstrated an understanding of the instructions.   The patient was advised to call back or seek an in-person evaluation if the symptoms worsen or if the condition fails to improve as anticipated.   [facility] MD OP Progress Note  09/08/2018 1:16 PM Connie Guzman  MRN:  [id number]  Chief Complaint:  Chief Complaint    Follow-up     HPI: Connie Guzman is a 28 year old African-American female, employed, separated, currently lives in [place], has a history of depression, anxiety was evaluated by telemedicine today.  Patient today reports she has been tolerating the Abilify well.  She however reports she continues to struggle with some depressive episodes.  She reports her day starts initially as happy and then she goes into a crash towards the end of the day.  She has been working closely with her therapist and has been seeing her every week now.  She reports her therapy sessions is very helpful.  She reports she continues to be compliant with Celexa.  She denies any side effects to the Celexa at this time.  Patient denies any suicidal thoughts, homicidal thoughts or perceptual disturbances at this time.  Patient reports she moved to [place] and will move into her own apartment soon.  She reports her sister is a good support system at this time.  Patient denies any other concerns today.    Visit Diagnosis:     ICD-10-CM   1. Bipolar 2 disorder (HCC) F31.81 ARIPiprazole (ABILIFY) 10 MG tablet   mixed episodes  2. GAD (generalized anxiety disorder) F41.1   3. 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  Past Medical History:  Past Medical History:  Diagnosis Date  . Abnormal Pap smear of cervix   . Anemia   . Anxiety   . Chlamydia   . Depression   . Dysmenorrhea   . Heart murmur   . Inflammation of cervix     Past Surgical History:  Procedure Laterality Date  . LEEP    . TONSILLECTOMY AND ADENOIDECTOMY  2009  . tubes in ears    . WISDOM TOOTH EXTRACTION  2012    Family Psychiatric History: Reviewed family psychiatric history from my progress note on 06/29/2017.  Family History:  Family History  Problem Relation Age of Onset  . Breast cancer Mother   . Hypertension Mother   . Bipolar disorder Mother   . Alcohol abuse Father   . Breast cancer Maternal Grandmother   . Diabetes Maternal Grandmother   . Hyperlipidemia Maternal Grandmother   . Hypertension Maternal Grandmother   . Other Maternal Grandmother        fibroid uterus  . Bipolar disorder Sister   . Diabetes Sister        Borderline  . Anxiety disorder Sister   . Depression Sister   . Bipolar disorder Sister   .  Anxiety disorder Sister   . Depression Sister   . Alcohol abuse Maternal Uncle   . Anxiety disorder Sister     Social History: I have reviewed social history from my progress note on 06/29/2017 Social History   Socioeconomic History  . Marital status: Married    Spouse name: brandon  . Number of children: 0  . Years of education: Not on file  . Highest education level: Associate degree: occupational, Hotel manager, or vocational program  Occupational History    Comment: full time  Social Needs  . Financial resource strain: Somewhat hard  . Food insecurity:    Worry: Often true    Inability:  Often true  . Transportation needs:    Medical: Yes    Non-medical: Yes  Tobacco Use  . Smoking status: Never Smoker  . Smokeless tobacco: Never Used  Substance and Sexual Activity  . Alcohol use: Yes    Alcohol/week: 0.0 - 1.0 standard drinks    Comment: Rarely  . Drug use: Yes    Types: Marijuana    Comment: last used 2 days ago   . Sexual activity: Yes    Partners: Male    Birth control/protection: None  Lifestyle  . Physical activity:    Days per week: 2 days    Minutes per session: 60 min  . Stress: Very much  Relationships  . Social connections:    Talks on phone: More than three times a week    Gets together: Never    Attends religious service: Never    Active member of club or organization: No    Attends meetings of clubs or organizations: Never    Relationship status: Married  Other Topics Concern  . Not on file  Social History Narrative  . Not on file    Allergies:  Allergies  Allergen Reactions  . Chocolate Anaphylaxis, Hives and Other (See Comments)    Migraines and dizziness     Metabolic Disorder Labs: Lab Results  Component Value Date   HGBA1C 4.8 11/21/2016   Lab Results  Component Value Date   PROLACTIN 11.4 08/06/2012   Lab Results  Component Value Date   CHOL 109 11/21/2016   TRIG 50 11/21/2016   HDL 49 11/21/2016   LDLCALC 50 11/21/2016   Lab Results  Component Value Date   TSH 0.625 11/21/2016   TSH 0.913 12/10/2014    Therapeutic Level Labs: No results found for: LITHIUM No results found for: VALPROATE No components found for:  CBMZ  Current Medications: Current Outpatient Medications  Medication Sig Dispense Refill  . acetaminophen (TYLENOL) 500 MG tablet Take 500-1,000 mg by mouth every 6 (six) hours as needed (for pain or headaches).     . ARIPiprazole (ABILIFY) 10 MG tablet Take 1 tablet (10 mg total) by mouth daily. 30 tablet 1  . aspirin-acetaminophen-caffeine (EXCEDRIN MIGRAINE) 976-734-19 MG tablet Take 2 tablets  by mouth every 6 (six) hours as needed for headache or migraine.    . citalopram (CELEXA) 20 MG tablet Take 0.5 tablets (10 mg total) by mouth daily. 90 tablet 0  . fexofenadine (ALLEGRA) 180 MG tablet Take 180 mg by mouth daily as needed for allergies or rhinitis.    . fluticasone (FLONASE) 50 MCG/ACT nasal spray Place 2 sprays into both nostrils daily as needed for allergies or rhinitis.    . hydrOXYzine (ATARAX/VISTARIL) 25 MG tablet Take 1 tablet (25 mg total) by mouth 3 (three) times daily as needed. 90 tablet 2  .  ondansetron (ZOFRAN-ODT) 4 MG disintegrating tablet Take 1 tablet (4 mg total) by mouth every 8 (eight) hours as needed for nausea or vomiting. 20 tablet 0  . traZODone (DESYREL) 50 MG tablet Take 1-2 tablets (50-100 mg total) by mouth at bedtime as needed for sleep. 180 tablet 0   No current facility-administered medications for this visit.      Musculoskeletal: Strength & Muscle Tone: within normal limits Gait & Station: normal Patient leans: N/A  Psychiatric Specialty Exam: Review of Systems  Psychiatric/Behavioral: Positive for depression.  All other systems reviewed and are negative.   There were no vitals taken for this visit.There is no height or weight on file to calculate BMI.  General Appearance: Casual  Eye Contact:  Fair  Speech:  Normal Rate  Volume:  Normal  Mood:  Depressed  Affect:  Appropriate  Thought Process:  Goal Directed and Descriptions of Associations: Intact  Orientation:  Full (Time, Place, and Person)  Thought Content: Logical   Suicidal Thoughts:  No  Homicidal Thoughts:  No  Memory:  Immediate;   Fair Recent;   Fair Remote;   Fair  Judgement:  Fair  Insight:  Fair  Psychomotor Activity:  Normal  Concentration:  Concentration: Fair and Attention Span: Fair  Recall:  [employer]Fair  Fund of Knowledge: Fair  Language: Fair  Akathisia:  No  Handed:  Right  AIMS (if indicated): denies tremors, rigidity  Assets:  Communication Skills Desire  for Improvement Housing Social Support  ADL's:  Intact  Cognition: WNL  Sleep:  Fair   Screenings: PHQ2-9     [facility]  PHQ-2 Total Score  3  4  PHQ-9 Total Score  13  15       Assessment and Plan: Connie Guzman is a 28 year old African-American female has a history of depression, anxiety, cannabis use disorder in early remission was evaluated by telemedicine today.  Patient is biologically predisposed given her family history of mental health problems.  Patient with psychosocial stressors of relationship struggles, work-related problems.  Patient is currently making progress on the current medication however will continue to benefit from medication readjustment since she continues to struggle with mood symptoms.  Plan as noted below.  Plan For  bipolar disorder type II-improving Increase Abilify to 10 mg p.o. daily. Continue Celexa 10 mg p.o. daily Continue CBT with Ms. Peacock  For anxiety disorder- improving Hydroxyzine as prescribed Celexa 10 mg p.o. daily  Insomnia-stable Trazodone 50 mg p.o. nightly as needed  For cannabis use disorder mild-early remission Patient has not used in the past 1 month.  We will continue to monitor closely.  Follow-up in clinic in 4 to 5 weeks or sooner if needed.  Appointment scheduled for July 10 at 10 AM. I have spent atleast 15 minutes non face to face with patient today. More than 50 % of the time was spent for psychoeducation and supportive psychotherapy and care coordination.  This note was generated in part or whole with voice recognition software. Voice recognition is usually quite accurate but there are transcription errors that can and very often do occur. I apologize for any typographical errors that were not detected and corrected.        Jomarie LongsSaramma Bayle Calvo, MD 09/08/2018, 1:16 PM

## 2018-09-09 ENCOUNTER — Ambulatory Visit: Payer: Managed Care, Other (non HMO) | Admitting: Physician Assistant

## 2018-09-23 ENCOUNTER — Telehealth: Payer: Self-pay

## 2018-09-23 NOTE — Telephone Encounter (Signed)
[facility] called pt is admited. Connie Guzman would like to speak with you

## 2018-09-24 NOTE — Telephone Encounter (Signed)
[place] - was transferred to Kiria's VM - left message to call back.

## 2018-10-08 ENCOUNTER — Other Ambulatory Visit: Payer: Self-pay

## 2018-10-08 ENCOUNTER — Ambulatory Visit (INDEPENDENT_AMBULATORY_CARE_PROVIDER_SITE_OTHER): Payer: Managed Care, Other (non HMO) | Admitting: Psychiatry

## 2018-10-08 ENCOUNTER — Encounter: Payer: Self-pay | Admitting: Psychiatry

## 2018-10-08 DIAGNOSIS — F1010 Alcohol abuse, uncomplicated: Secondary | ICD-10-CM | POA: Diagnosis not present

## 2018-10-08 DIAGNOSIS — F411 Generalized anxiety disorder: Secondary | ICD-10-CM | POA: Insufficient documentation

## 2018-10-08 DIAGNOSIS — F1211 Cannabis abuse, in remission: Secondary | ICD-10-CM | POA: Diagnosis not present

## 2018-10-08 DIAGNOSIS — F3181 Bipolar II disorder: Secondary | ICD-10-CM

## 2018-10-08 NOTE — Progress Notes (Signed)
Virtual Visit via Video Note  I connected with Connie Guzman on 10/08/18 at 10:00 AM EDT by a video enabled telemedicine application and verified that I am speaking with the correct person using two identifiers.   I discussed the limitations of evaluation and management by telemedicine and the availability of in person appointments. The patient expressed understanding and agreed to proceed.   I discussed the assessment and treatment plan with the patient. The patient was provided an opportunity to ask questions and all were answered. The patient agreed with the plan and demonstrated an understanding of the instructions.   The patient was advised to call back or seek an in-person evaluation if the symptoms worsen or if the condition fails to improve as anticipated.   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  A video call was initiated however it had to be changed to a phone call due to connection problem.  Patient was recently admitted to [facility] inpatient facility from 6/24 to 09/27/2018.  Reviewed and discussed discharge summary received from [facility] today with patient.  Patient was treated by Dr. Cassie FreerMohamed Saeed- " patient with diagnosis of PTSD, bipolar disorder and cannabis abuse was discharged on Abilify 5 mg, Celexa 10 mg and trazodone 50 mg at bedtime.  Patient was admitted for suicidal ideation with plan to overdose on medication after an argument with her sister.'  Patient today reports she is currently doing better.  She reports she just had a bad day after she had  the argument with her sister and decided to check herself in to the hospital.  She currently denies any significant depressive symptoms.  She denies any suicidality.  She reports sleep is good on the trazodone.  She continues to cut back on cannabis.  She also reports she is no longer abusing any alcohol and continue to want to stay away.  Patient has been noncompliant with her psychotherapy visits with Ms. Peacock since her last visit in May.  Discussed with patient to restart psychotherapy sessions again.  She reports she currently has no place of herself to stay and has been living in a hotel as well as with friends and family.  She however reports work is going well.  She denies any other concerns today. Visit Diagnosis:    ICD-10-CM   1. Bipolar 2 disorder (HCC)  F31.81    mixed episodes  2. GAD (generalized anxiety disorder)  F41.1   3. Cannabis use disorder, mild, in early remission  F12.11   4. Mild alcohol abuse  F10.10     Past Psychiatric History: I have reviewed past psychiatric history from my progress note on 06/29/2017.  Past Medical History:  Past Medical History:  Diagnosis Date  . Abnormal Pap smear of cervix   . Anemia   . Anxiety   . Chlamydia   . Depression   . Dysmenorrhea   . Heart murmur   . Inflammation of cervix     Past Surgical History:  Procedure Laterality Date  . LEEP    . TONSILLECTOMY AND ADENOIDECTOMY  2009  .  tubes in ears    . WISDOM TOOTH EXTRACTION  2012    Family Psychiatric History: I have reviewed family psychiatric history from my progress note on 06/29/2017. Family History:  Family History  Problem Relation Age of Onset  . Breast cancer Mother   . Hypertension Mother   . Bipolar disorder Mother   . Alcohol abuse Father   . Breast cancer Maternal Grandmother   . Diabetes Maternal Grandmother   . Hyperlipidemia Maternal Grandmother   . Hypertension Maternal Grandmother   . Other Maternal Grandmother        fibroid uterus  .  Bipolar disorder Sister   . Diabetes Sister        Borderline  . Anxiety disorder Sister   . Depression Sister   . Bipolar disorder Sister   . Anxiety disorder Sister   . Depression Sister   . Alcohol abuse Maternal Uncle   . Anxiety disorder Sister     Social History: I have reviewed social history from my progress note on 06/29/2017. Social History   Socioeconomic History  . Marital status: Married    Spouse name: brandon  . Number of children: 0  . Years of education: Not on file  . Highest education level: Associate degree: occupational, Scientist, product/process developmenttechnical, or vocational program  Occupational History    Comment: full time  Social Needs  . Financial resource strain: Somewhat hard  . Food insecurity    Worry: Often true    Inability: Often true  . Transportation needs    Medical: Yes    Non-medical: Yes  Tobacco Use  . Smoking status: Never Smoker  . Smokeless tobacco: Never Used  Substance and Sexual Activity  . Alcohol use: Yes    Alcohol/week: 0.0 - 1.0 standard drinks    Comment: Rarely  . Drug use: Yes    Types: Marijuana    Comment: last used 2 days ago   . Sexual activity: Yes    Partners: Male    Birth control/protection: None  Lifestyle  . Physical activity    Days per week: 2 days    Minutes per session: 60 min  . Stress: Very much  Relationships  . Social Musicianconnections    Talks on phone: More than three times a week    Gets together: Never    Attends religious service: Never    Active member of club or organization: No    Attends meetings of clubs or organizations: Never    Relationship status: Married  Other Topics Concern  . Not on file  Social History Narrative  . Not on file    Allergies:  Allergies  Allergen Reactions  . Chocolate Anaphylaxis, Hives and Other (See Comments)    Migraines and dizziness     Metabolic Disorder Labs: Lab Results  Component Value Date   HGBA1C 4.8 11/21/2016   Lab Results  Component Value Date   PROLACTIN  11.4 08/06/2012   Lab Results  Component Value Date   CHOL 109 11/21/2016   TRIG 50 11/21/2016   HDL 49 11/21/2016   LDLCALC 50 11/21/2016   Lab Results  Component Value Date   TSH 0.625 11/21/2016   TSH 0.913 12/10/2014    Therapeutic Level Labs: No results found for: LITHIUM No results found for: VALPROATE No components found for:  CBMZ  Current Medications: Current Outpatient Medications  Medication Sig Dispense Refill  . acetaminophen (TYLENOL) 500 MG tablet Take 500-1,000 mg by mouth every 6 (six) hours as  needed (for pain or headaches).     . ARIPiprazole (ABILIFY) 5 MG tablet     . aspirin-acetaminophen-caffeine (EXCEDRIN MIGRAINE) 250-250-65 MG tablet Take 2 tablets by mouth every 6 (six) hours as needed for headache or migraine.    . citalopram (CELEXA) 20 MG tablet Take 0.5 tablets (10 mg total) by mouth daily. 90 tablet 0  . fexofenadine (ALLEGRA) 180 MG tablet Take 180 mg by mouth daily as needed for allergies or rhinitis.    . fluticasone (FLONASE) 50 MCG/ACT nasal spray Place 2 sprays into both nostrils daily as needed for allergies or rhinitis.    . hydrOXYzine (ATARAX/VISTARIL) 25 MG tablet Take 1 tablet (25 mg total) by mouth 3 (three) times daily as needed. 90 tablet 2  . ondansetron (ZOFRAN-ODT) 4 MG disintegrating tablet Take 1 tablet (4 mg total) by mouth every 8 (eight) hours as needed for nausea or vomiting. 20 tablet 0  . traZODone (DESYREL) 50 MG tablet Take 1-2 tablets (50-100 mg total) by mouth at bedtime as needed for sleep. 180 tablet 0   No current facility-administered medications for this visit.      Musculoskeletal: Strength & Muscle Tone: UTA Gait & Station: Reports as WNL Patient leans: N/A  Psychiatric Specialty Exam: Review of Systems  Psychiatric/Behavioral: The patient is nervous/anxious.   All other systems reviewed and are negative.   There were no vitals taken for this visit.There is no height or weight on file to calculate BMI.   General Appearance: UTA  Eye Contact:  UTA  Speech:  Clear and Coherent  Volume:  Normal  Mood:  Anxious improving  Affect:  UTA  Thought Process:  Goal Directed and Descriptions of Associations: Intact  Orientation:  Full (Time, Place, and Person)  Thought Content: Logical   Suicidal Thoughts:  No  Homicidal Thoughts:  No  Memory:  Immediate;   Fair Recent;   Fair Remote;   Fair  Judgement:  Fair  Insight:  Fair  Psychomotor Activity:  UTA  Concentration:  Concentration: Fair and Attention Span: Fair  Recall:  [employer] of Knowledge: Fair  Language: Fair  Akathisia:  No  Handed:  Right  AIMS (if indicated): denies tremors, stiffness , rigidity  Assets:  Communication Skills Desire for Improvement Housing Social Support  ADL's:  Intact  Cognition: WNL  Sleep:  Fair   Screenings: PHQ2-9     Office Visit from 06/02/2017 in [place] Visit from 11/20/2016 in [place]  PHQ-2 Total Score  3  4  PHQ-9 Total Score  13  15       Assessment and Plan: Connie Guzman is a 28 year old African-American female who has a history of bipolar disorder, cannabis use disorder, alcohol abuse episodic was evaluated by telemedicine today.  Patient is biologically predisposed given her family history of mental health problems as well as her own substance abuse problems.  She has psychosocial stressors of relationship struggles.  Patient was recently discharged from [facility] inpatient facility.  She currently however denies any significant mood symptoms and denies suicidality.  Discussed plan as noted below.  Plan For bipolar disorder type II-improving Abilify 5 mg p.o. daily Celexa 10 mg p.o. daily Continue CBT with Ms. Peacock.  Discussed with patient to reach out to Ms. Peacock to schedule a sooner appointment.  For anxiety disorder-improving Hydroxyzine as prescribed Celexa 10 mg p.o. daily  For insomnia-stable Trazodone 50 mg p.o. nightly  as needed  For cannabis use disorder  in early remission-mild She continues to stay away.  For alcohol abuse-episodic- she reports she is cutting back will continue to monitor closely.  I have reviewed and discussed medical records from [facility] dated 09/22/2018-by Dr. Cassie FreerMohamed Saeed as summarized above.  Follow-up in clinic in 4 weeks or sooner if needed.  August 10 at 2 PM  I have spent atleast 25 minutes non  face to face with patient today. More than 50 % of the time was spent for psychoeducation and supportive psychotherapy and care coordination.  This note was generated in part or whole with voice recognition software. Voice recognition is usually quite accurate but there are transcription errors that can and very often do occur. I apologize for any typographical errors that were not detected and corrected.         Jomarie LongsSaramma Juan Olthoff, MD 10/08/2018, 1:30 PM

## 2018-10-14 ENCOUNTER — Ambulatory Visit: Payer: Managed Care, Other (non HMO) | Admitting: Licensed Clinical Social Worker

## 2018-10-14 ENCOUNTER — Other Ambulatory Visit: Payer: Self-pay

## 2018-11-08 ENCOUNTER — Ambulatory Visit: Payer: Managed Care, Other (non HMO) | Admitting: Licensed Clinical Social Worker

## 2018-11-08 ENCOUNTER — Ambulatory Visit (INDEPENDENT_AMBULATORY_CARE_PROVIDER_SITE_OTHER): Payer: Managed Care, Other (non HMO) | Admitting: Psychiatry

## 2018-11-08 ENCOUNTER — Encounter: Payer: Self-pay | Admitting: Psychiatry

## 2018-11-08 ENCOUNTER — Other Ambulatory Visit: Payer: Self-pay

## 2018-11-08 DIAGNOSIS — F1010 Alcohol abuse, uncomplicated: Secondary | ICD-10-CM | POA: Diagnosis not present

## 2018-11-08 DIAGNOSIS — F411 Generalized anxiety disorder: Secondary | ICD-10-CM

## 2018-11-08 DIAGNOSIS — F1211 Cannabis abuse, in remission: Secondary | ICD-10-CM

## 2018-11-08 DIAGNOSIS — F3181 Bipolar II disorder: Secondary | ICD-10-CM | POA: Diagnosis not present

## 2018-11-08 MED ORDER — CITALOPRAM HYDROBROMIDE 10 MG PO TABS: 10.0000 mg | ORAL_TABLET | Freq: Every day | ORAL | 1 refills | Status: DC

## 2018-11-08 MED ORDER — ARIPIPRAZOLE 5 MG PO TABS: 5.0000 mg | ORAL_TABLET | Freq: Every day | ORAL | 1 refills | Status: DC

## 2018-11-08 MED ORDER — TRAZODONE HCL 50 MG PO TABS: 50.0000 mg | ORAL_TABLET | Freq: Every evening | ORAL | 1 refills | Status: DC | PRN

## 2018-11-08 NOTE — Progress Notes (Signed)
Virtual Visit via Telephone Note  I connected with Connie Guzman on 11/08/18 at  2:00 PM EDT by telephone and verified that I am speaking with the correct person using two identifiers.   I discussed the limitations, risks, security and privacy concerns of performing an evaluation and management service by telephone and the availability of in person appointments. I also discussed with the patient that there may be a patient responsible charge related to this service. The patient expressed understanding and agreed to proceed.    I discussed the assessment and treatment plan with the patient. The patient was provided an opportunity to ask questions and all were answered. The patient agreed with the plan and demonstrated an understanding of the instructions.   The patient was advised to call back or seek an in-person evaluation if the symptoms worsen or if the condition fails to improve as anticipated.   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  Video call was initiated however patient declined.  Patient today reports she currently does not have a place to stay.  She is trying to save up and is trying to get a place for herself.  She reports she has not been able to stay with her friends anymore like she used to before.  She reports she also was noncompliant with all her medications since she had left her bottles of medications at the place where she stayed.  She could not get it back even though she requested them to give it back to her.  She reports they could not find it and she had misplaced it.  Patient reports she had  started drinking alcohol again and was trying to self medicate herself.  The last time she drank was a week ago.  Patient reports her therapy appointment got rescheduled and that she continues to be motivated to stay in therapy.  She does have upcoming appointments with her therapist.  Patient with chronic suicidality reported some suicidal thoughts a week ago.  She currently denies any active thoughts or plans.  Patient reports sleep is restless now that she is not on medications.  Some time was spent providing supportive psychotherapy.  Discussed with patient to restart medications.  Also discussed with her to restart psychotherapy sessions.  I have also send a message to Ms. Nolon RodNicole Peacock regarding patient.  Discussed with patient about AA meetings.  She reports she has stopped drinking a week ago and would consider it in the future.  Crisis plan discussed with patient.  Patient reports she continues to work and her work is going well.  Visit Diagnosis:    ICD-10-CM   1. Bipolar 2 disorder (HCC)  F31.81 citalopram (CELEXA) 10 MG tablet    ARIPiprazole (ABILIFY) 5 MG tablet   mixed ,mild  2. GAD (generalized anxiety disorder)  F41.1 citalopram (CELEXA) 10 MG tablet    ARIPiprazole (ABILIFY) 5 MG tablet    traZODone (DESYREL) 50 MG tablet  3. Cannabis use disorder, mild, in early remission  F12.11   4. Mild alcohol abuse  F10.10     Past Psychiatric History: I have reviewed past psychiatric history from  my progress note on 06/29/2017.  Past Medical History:  Past Medical History:  Diagnosis Date  . Abnormal Pap smear of cervix   . Anemia   . Anxiety   . Chlamydia   . Depression   . Dysmenorrhea   . Heart murmur   . Inflammation of cervix     Past Surgical History:  Procedure Laterality Date  . LEEP    . TONSILLECTOMY AND ADENOIDECTOMY  2009  . tubes in ears    . WISDOM TOOTH EXTRACTION  2012    Family Psychiatric History: I have reviewed family psychiatric history from  my progress note on 06/29/2017.  Family History:  Family History  Problem Relation Age of Onset  . Breast cancer Mother   . Hypertension Mother   . Bipolar disorder Mother   . Alcohol abuse Father   . Breast cancer Maternal Grandmother   . Diabetes Maternal Grandmother   . Hyperlipidemia Maternal Grandmother   . Hypertension Maternal Grandmother   . Other Maternal Grandmother        fibroid uterus  . Bipolar disorder Sister   . Diabetes Sister        Borderline  . Anxiety disorder Sister   . Depression Sister   . Bipolar disorder Sister   . Anxiety disorder Sister   . Depression Sister   . Alcohol abuse Maternal Uncle   . Anxiety disorder Sister     Social History: I have reviewed social history from my progress note on 06/29/2017. Social History   Socioeconomic History  . Marital status: Married    Spouse name: brandon  . Number of children: 0  . Years of education: Not on file  . Highest education level: Associate degree: occupational, Hotel manager, or vocational program  Occupational History    Comment: full time  Social Needs  . Financial resource strain: Somewhat hard  . Food insecurity    Worry: Often true    Inability: Often true  . Transportation needs    Medical: Yes    Non-medical: Yes  Tobacco Use  . Smoking status: Never Smoker  . Smokeless tobacco: Never Used  Substance and Sexual Activity  . Alcohol use: Yes    Alcohol/week: 0.0 - 1.0 standard drinks    Comment: Rarely  . Drug use: Yes    Types: Marijuana    Comment: last used 2 days ago   . Sexual activity: Yes    Partners: Male    Birth control/protection: None  Lifestyle  . Physical activity    Days per week: 2 days    Minutes per session: 60 min  . Stress: Very much  Relationships  . Social Herbalist on phone: More than three times a week    Gets together: Never    Attends religious service: Never    Active member of club or organization: No    Attends meetings of clubs or  organizations: Never    Relationship status: Married  Other Topics Concern  . Not on file  Social History Narrative  . Not on file    Allergies:  Allergies  Allergen Reactions  . Chocolate Anaphylaxis, Hives and Other (See Comments)    Migraines and dizziness     Metabolic Disorder Labs: Lab Results  Component Value Date   HGBA1C 4.8 11/21/2016   Lab Results  Component Value Date   PROLACTIN 11.4 08/06/2012   Lab Results  Component Value Date   CHOL 109 11/21/2016   TRIG  50 11/21/2016   HDL 49 11/21/2016   LDLCALC 50 11/21/2016   Lab Results  Component Value Date   TSH 0.625 11/21/2016   TSH 0.913 12/10/2014    Therapeutic Level Labs: No results found for: LITHIUM No results found for: VALPROATE No components found for:  CBMZ  Current Medications: Current Outpatient Medications  Medication Sig Dispense Refill  . acetaminophen (TYLENOL) 500 MG tablet Take 500-1,000 mg by mouth every 6 (six) hours as needed (for pain or headaches).     . ARIPiprazole (ABILIFY) 5 MG tablet Take 1 tablet (5 mg total) by mouth daily. 30 tablet 1  . aspirin-acetaminophen-caffeine (EXCEDRIN MIGRAINE) 250-250-65 MG tablet Take 2 tablets by mouth every 6 (six) hours as needed for headache or migraine.    . citalopram (CELEXA) 10 MG tablet Take 1 tablet (10 mg total) by mouth daily. 30 tablet 1  . fexofenadine (ALLEGRA) 180 MG tablet Take 180 mg by mouth daily as needed for allergies or rhinitis.    . fluticasone (FLONASE) 50 MCG/ACT nasal spray Place 2 sprays into both nostrils daily as needed for allergies or rhinitis.    . hydrOXYzine (ATARAX/VISTARIL) 25 MG tablet Take 1 tablet (25 mg total) by mouth 3 (three) times daily as needed. 90 tablet 2  . ondansetron (ZOFRAN-ODT) 4 MG disintegrating tablet Take 1 tablet (4 mg total) by mouth every 8 (eight) hours as needed for nausea or vomiting. 20 tablet 0  . traZODone (DESYREL) 50 MG tablet Take 1 tablet (50 mg total) by mouth at bedtime as  needed for sleep. 30 tablet 1   No current facility-administered medications for this visit.      Musculoskeletal: Strength & Muscle Tone: UTA Gait & Station: Reports as WNL Patient leans: N/A  Psychiatric Specialty Exam: Review of Systems  Psychiatric/Behavioral: The patient is nervous/anxious.   All other systems reviewed and are negative.   There were no vitals taken for this visit.There is no height or weight on file to calculate BMI.  General Appearance: UTA  Eye Contact:  UTA  Speech:  Normal Rate  Volume:  Normal  Mood:  Anxious  Affect:  UTA  Thought Process:  Goal Directed and Descriptions of Associations: Intact  Orientation:  Full (Time, Place, and Person)  Thought Content: Logical   Suicidal Thoughts:  No  Homicidal Thoughts:  No  Memory:  Immediate;   Fair Recent;   Fair Remote;   Fair  Judgement:  Fair  Insight:  Fair  Psychomotor Activity:  UTA  Concentration:  Concentration: Fair and Attention Span: Fair  Recall:  [employer]  Fund of Knowledge: Fair  Language: Fair  Akathisia:  No  Handed:  Right  AIMS (if indicated): Denies tremors, rigidity  Assets:  Communication Skills Desire for Improvement Social Support  ADL's:  Intact  Cognition: WNL  Sleep:  Fair   Screenings: PHQ2-9     [facility] Visit from [facility]/2017 [facility] Office Visit from 11/20/2016 in [facility]  PHQ-2 Total Score  3  4  PHQ-9 Total Score  13  15       Assessment and Plan: Jacara is a 28 year old African-American female who has a history of bipolar disorder, cannabis use disorder, alcohol abuse episodic was evaluated by telemedicine today.  Patient is biologically predisposed given her family history of mental health problems as well as her own substance abuse problems.  She has psychosocial stressors of relationship struggles.  Patient was recently discharged from [facility]  fac[facility].  Patient continues to be noncompliant with  medications as well as has been homeless.  Patient will benefit from restarting medications as well as more intensive psychotherapy sessions.  Plan For bipolar disorder type II-unstable Restart Abilify 5 mg p.o. daily Restart Celexa 10 mg p.o. daily I have sent a message to Ms. Peacock to call patient back.  Patient does have upcoming appointment with her therapist.  For anxiety disorder-unstable Hydroxyzine as prescribed Celexa 10 mg p.o. daily  For insomnia-unstable Restart trazodone 50 mg p.o. 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  She had relapsed but has not drank in a week.  Crisis plan discussed with patient.  Patient is homeless, noncompliant on medications, has a history of suicidal ideation in the past, does not have good social support system and has substance abuse problems as well as chronic mental health problems.  Patient however currently denies any suicidality, and is motivated to get help.  She continues to be employed which is also positive factor.  Acute risk for suicide is low, however chronic risk is moderate.  Follow-up in clinic in 3 to 4 weeks or sooner if needed.  In the meantime patient to work with her therapist.  I have sent a message to Ms. Nicole Peacock-therapist.  September 2 at 1:45 PM  I have spent atleast 15 minutes non face to face with patient today. More than 50 % of the time was spent for psychoeducation and supportive psychotherapy and care coordination.  This note was generated in part or whole with voice recognition software. Voice recognition is usually quite accurate but there are transcription errors that can and very often do occur. I apologize for any typographical errors that were not detected and corrected.        Jomarie LongsSaramma Annalynne Ibanez, MD 11/08/2018, 3:58 PM

## 2018-11-15 ENCOUNTER — Ambulatory Visit (INDEPENDENT_AMBULATORY_CARE_PROVIDER_SITE_OTHER): Payer: Managed Care, Other (non HMO) | Admitting: Licensed Clinical Social Worker

## 2018-11-15 ENCOUNTER — Other Ambulatory Visit: Payer: Self-pay

## 2018-11-15 DIAGNOSIS — F3181 Bipolar II disorder: Secondary | ICD-10-CM

## 2018-11-15 NOTE — Progress Notes (Signed)
   THERAPIST PROGRESS NOTE  Session Time: 74min  Participation Level: Active  Behavioral Response: CasualAlertNegative  Type of Therapy: Individual Therapy  Treatment Goals addressed: Coping and Diagnosis: Bipolar  Interventions: CBT and Motivational Interviewing  Summary: Connie Guzman is a 28 y.o. female who presents with continued symptoms of diagnosis.  Education on her diagnosis.  Discussion on triggers.  Patient is unaware of triggers.  provided encouragement of how helpful journaling can be and to create entries several times per day.  Assisted with problem solving and the importance of having housing.   Suicidal/Homicidal: No   Plan: Return again in 1 weeks.  Diagnosis: Axis I: Bipolar, Depressed    Axis II: No diagnosis    Lubertha South, LCSW 11/15/2018

## 2018-11-18 ENCOUNTER — Ambulatory Visit: Payer: Managed Care, Other (non HMO) | Admitting: Licensed Clinical Social Worker

## 2018-11-22 ENCOUNTER — Ambulatory Visit: Payer: Managed Care, Other (non HMO) | Admitting: Licensed Clinical Social Worker

## 2018-11-29 ENCOUNTER — Ambulatory Visit: Payer: Managed Care, Other (non HMO) | Admitting: Licensed Clinical Social Worker

## 2018-12-01 ENCOUNTER — Other Ambulatory Visit: Payer: Self-pay

## 2018-12-01 ENCOUNTER — Encounter: Payer: Self-pay | Admitting: Psychiatry

## 2018-12-01 ENCOUNTER — Ambulatory Visit (INDEPENDENT_AMBULATORY_CARE_PROVIDER_SITE_OTHER): Payer: Managed Care, Other (non HMO) | Admitting: Psychiatry

## 2018-12-01 DIAGNOSIS — F1211 Cannabis abuse, in remission: Secondary | ICD-10-CM | POA: Diagnosis not present

## 2018-12-01 DIAGNOSIS — F3181 Bipolar II disorder: Secondary | ICD-10-CM | POA: Diagnosis not present

## 2018-12-01 DIAGNOSIS — F1010 Alcohol abuse, uncomplicated: Secondary | ICD-10-CM

## 2018-12-01 DIAGNOSIS — F411 Generalized anxiety disorder: Secondary | ICD-10-CM | POA: Diagnosis not present

## 2018-12-01 NOTE — Progress Notes (Signed)
Virtual Visit via Video Note  I connected with Connie Guzman on 12/01/18 at  1:45 PM EDT by a video enabled telemedicine application and verified that I am speaking with the correct person using two identifiers.   I discussed the limitations of evaluation and management by telemedicine and the availability of in person appointments. The patient expressed understanding and agreed to proceed   I discussed the assessment and treatment plan with the patient. The patient was provided an opportunity to ask questions and all were answered. The patient agreed with the plan and demonstrated an understanding of the instructions.   The patient was advised to call back or seek an in-person evaluation if the symptoms worsen or if the condition fails to improve as anticipated.  Provider Location : [facility] Patient Location : Work  BH MD OP Progress Note  12/01/2018 5:46 PM Connie Guzman  MRN:  [id number]  Chief Complaint:  Chief Complaint    Follow-up     HPI: Connie Guzman is a 28 year old African-American female, employed, separated, currently living at [facility], has a history of bipolar disorder, GAD, cannabis abuse, alcohol abuse currently improving, was evaluated by telemedicine today.  Patient today reports that she is currently compliant on her medications as prescribed.  She does feel anxious on and off however has been coping better.  She reports sleep as good on the current medications.  She denies any suicidality, homicidality or perceptual disturbances.  Patient reports she has been using cannabis-irritable occasionally.  She however has been staying away from alcohol.  Patient reports she is trying to find a home to move into however currently she is staying at [facility].  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  She reports she had an appointment with Ms. Peacock and has upcoming appointment, she will call to confirm.  She denies any other  concerns today. Visit Diagnosis:    ICD-10-CM   1. Bipolar 2 disorder (HCC)  F31.81    mixed , mild  2. GAD (generalized anxiety disorder)  F41.1   3. Cannabis use disorder, mild, in early remission  F12.11   4. Mild alcohol abuse  F10.10    improving    Past Psychiatric History: I have reviewed past psychiatric history from my progress note on 06/29/2017.  Past Medical History:  Past Medical History:  Diagnosis Date  . Abnormal Pap smear of cervix   . Anemia   . Anxiety   . Chlamydia   . Depression   . Dysmenorrhea   . Heart murmur   . Inflammation of cervix     Past Surgical History:  Procedure Laterality Date  . LEEP    . TONSILLECTOMY AND ADENOIDECTOMY  2009  . tubes in ears    . WISDOM TOOTH EXTRACTION  2012    Family Psychiatric History: I have reviewed family psychiatric history from my progress note on 06/29/2017. Family History:  Family History  Problem Relation Age of Onset  . Breast cancer Mother   . Hypertension Mother   . Bipolar disorder Mother   . Alcohol abuse Father   . Breast cancer Maternal Grandmother   . Diabetes Maternal Grandmother   . Hyperlipidemia Maternal Grandmother   . Hypertension Maternal Grandmother   . Other Maternal Grandmother        fibroid uterus  . Bipolar disorder Sister   . Diabetes Sister        Borderline  . Anxiety disorder Sister   .  Depression Sister   . Bipolar disorder Sister   . Anxiety disorder Sister   . Depression Sister   . Alcohol abuse Maternal Uncle   . Anxiety disorder Sister     Social History: I have reviewed social history from my progress note on 06/29/2017. Social History   Socioeconomic History  . Marital status: Married    Spouse name: brandon  . Number of children: 0  . Years of education: Not on file  . Highest education level: Associate degree: occupational, Scientist, product/process developmenttechnical, or vocational program  Occupational History    Comment: full time  Social Needs  . Financial resource strain: Somewhat  hard  . Food insecurity    Worry: Often true    Inability: Often true  . Transportation needs    Medical: Yes    Non-medical: Yes  Tobacco Use  . Smoking status: Never Smoker  . Smokeless tobacco: Never Used  Substance and Sexual Activity  . Alcohol use: Yes    Alcohol/week: 0.0 - 1.0 standard drinks    Comment: Rarely  . Drug use: Yes    Types: Marijuana    Comment: last used 2 days ago   . Sexual activity: Yes    Partners: Male    Birth control/protection: None  Lifestyle  . Physical activity    Days per week: 2 days    Minutes per session: 60 min  . Stress: Very much  Relationships  . Social Musicianconnections    Talks on phone: More than three times a week    Gets together: Never    Attends religious service: Never    Active member of club or organization: No    Attends meetings of clubs or organizations: Never    Relationship status: Married  Other Topics Concern  . Not on file  Social History Narrative  . Not on file    Allergies:  Allergies  Allergen Reactions  . Chocolate Anaphylaxis, Hives and Other (See Comments)    Migraines and dizziness     Metabolic Disorder Labs: Lab Results  Component Value Date   HGBA1C 4.8 11/21/2016   Lab Results  Component Value Date   PROLACTIN 11.4 08/06/2012   Lab Results  Component Value Date   CHOL 109 11/21/2016   TRIG 50 11/21/2016   HDL 49 11/21/2016   LDLCALC 50 11/21/2016   Lab Results  Component Value Date   TSH 0.625 11/21/2016   TSH 0.913 12/10/2014    Therapeutic Level Labs: No results found for: LITHIUM No results found for: VALPROATE No components found for:  CBMZ  Current Medications: Current Outpatient Medications  Medication Sig Dispense Refill  . acetaminophen (TYLENOL) 500 MG tablet Take 500-1,000 mg by mouth every 6 (six) hours as needed (for pain or headaches).     . ARIPiprazole (ABILIFY) 5 MG tablet Take 1 tablet (5 mg total) by mouth daily. 30 tablet 1  .  aspirin-acetaminophen-caffeine (EXCEDRIN MIGRAINE) 250-250-65 MG tablet Take 2 tablets by mouth every 6 (six) hours as needed for headache or migraine.    . citalopram (CELEXA) 10 MG tablet Take 1 tablet (10 mg total) by mouth daily. 30 tablet 1  . fexofenadine (ALLEGRA) 180 MG tablet Take 180 mg by mouth daily as needed for allergies or rhinitis.    . fluticasone (FLONASE) 50 MCG/ACT nasal spray Place 2 sprays into both nostrils daily as needed for allergies or rhinitis.    . hydrOXYzine (ATARAX/VISTARIL) 25 MG tablet Take 1 tablet (25 mg total) by  mouth 3 (three) times daily as needed. 90 tablet 2  . ondansetron (ZOFRAN-ODT) 4 MG disintegrating tablet Take 1 tablet (4 mg total) by mouth every 8 (eight) hours as needed for nausea or vomiting. 20 tablet 0  . traZODone (DESYREL) 50 MG tablet Take 1 tablet (50 mg total) by mouth at bedtime as needed for sleep. 30 tablet 1   No current facility-administered medications for this visit.      Musculoskeletal: Strength & Muscle Tone: UTA Gait & Station: normal Patient leans: N/A  Psychiatric Specialty Exam: Review of Systems  Psychiatric/Behavioral: The patient is nervous/anxious.   All other systems reviewed and are negative.   There were no vitals taken for this visit.There is no height or weight on file to calculate BMI.  General Appearance: Casual  Eye Contact:  Good  Speech:  Clear and Coherent  Volume:  Normal  Mood:  Anxious  Affect:  Congruent  Thought Process:  Goal Directed and Descriptions of Associations: Intact  Orientation:  Full (Time, Place, and Person)  Thought Content: Logical   Suicidal Thoughts:  No  Homicidal Thoughts:  No  Memory:  Immediate;   Fair Recent;   Fair Remote;   Fair  Judgement:  Fair  Insight:  Fair  Psychomotor Activity:  Normal  Concentration:  Concentration: Fair and Attention Span: Fair  Recall:  [employer] of Knowledge: Fair  Language: Fair  Akathisia:  No  Handed:  Right  AIMS (if  indicated):Denies tremors, rigidity  Assets:  Communication Skills Desire for Improvement Talents/Skills Transportation Vocational/Educational  ADL's:  Intact  Cognition: WNL  Sleep:  Fair   Screenings: PHQ2-9     Office Visit from 06/02/2017 in [place] Visit from 11/20/2016 in [place]  PHQ-2 Total Score  3  4  PHQ-9 Total Score  13  15       Assessment and Plan: Patient is a 28 year old African-American female who has a history of bipolar disorder, cannabis use disorder, alcohol abuse episodic was evaluated by telemedicine today.  She is biologically predisposed given her family history of mental health problems as well as her own substance abuse problems.  She also has psychosocial stressors of relationship struggles, as well as homelessness, lack of social support.  Patient however is currently making progress and is more compliant with her medications as well as therapy sessions.  Plan Bipolar disorder type II-some progress Abilify 5 mg p.o. daily Celexa 10 mg p.o. daily Continue psychotherapy sessions with Ms. Peacock.  Anxiety disorder-improving Hydroxyzine as prescribed Celexa 10 mg p.o. daily  Insomnia-improving Trazodone 50 mg p.o. nightly as needed  Cannabis use disorder mild- unstable Provided substance abuse counseling.  Alcohol use disorder mild- improving She is currently working on cutting back.  Follow-up in clinic in 4 weeks or sooner if needed.  October 5 at 2 PM.  I have spent atleast 15 MINUTES NON face to face with patient today. More than 50 % of the time was spent for psychoeducation and supportive psychotherapy and care coordination. This note was generated in part or whole with voice recognition software. Voice recognition is usually quite accurate but there are transcription errors that can and very often do occur. I apologize for any typographical errors that were not detected and  corrected.        Ursula Alert, MD 12/01/2018, 5:46 PM

## 2018-12-13 ENCOUNTER — Ambulatory Visit: Payer: Managed Care, Other (non HMO) | Admitting: Licensed Clinical Social Worker

## 2018-12-20 ENCOUNTER — Ambulatory Visit: Payer: Managed Care, Other (non HMO) | Admitting: Licensed Clinical Social Worker

## 2018-12-23 ENCOUNTER — Other Ambulatory Visit: Payer: Self-pay

## 2018-12-23 ENCOUNTER — Ambulatory Visit: Payer: Managed Care, Other (non HMO) | Admitting: Licensed Clinical Social Worker

## 2018-12-27 ENCOUNTER — Ambulatory Visit: Payer: Managed Care, Other (non HMO) | Admitting: Licensed Clinical Social Worker

## 2018-12-30 ENCOUNTER — Other Ambulatory Visit: Payer: Self-pay

## 2018-12-30 ENCOUNTER — Ambulatory Visit: Payer: Managed Care, Other (non HMO) | Admitting: Licensed Clinical Social Worker

## 2019-01-03 ENCOUNTER — Other Ambulatory Visit: Payer: Self-pay

## 2019-01-03 ENCOUNTER — Encounter: Payer: Self-pay | Admitting: Psychiatry

## 2019-01-03 ENCOUNTER — Ambulatory Visit (INDEPENDENT_AMBULATORY_CARE_PROVIDER_SITE_OTHER): Payer: Managed Care, Other (non HMO) | Admitting: Psychiatry

## 2019-01-03 DIAGNOSIS — F411 Generalized anxiety disorder: Secondary | ICD-10-CM

## 2019-01-03 DIAGNOSIS — F1010 Alcohol abuse, uncomplicated: Secondary | ICD-10-CM | POA: Diagnosis not present

## 2019-01-03 DIAGNOSIS — F1211 Cannabis abuse, in remission: Secondary | ICD-10-CM

## 2019-01-03 DIAGNOSIS — F3181 Bipolar II disorder: Secondary | ICD-10-CM

## 2019-01-03 MED ORDER — CITALOPRAM HYDROBROMIDE 10 MG PO TABS: 10.0000 mg | ORAL_TABLET | Freq: Every day | ORAL | 1 refills | Status: AC

## 2019-01-03 MED ORDER — ARIPIPRAZOLE 5 MG PO TABS: 5.0000 mg | ORAL_TABLET | Freq: Every day | ORAL | 1 refills | Status: AC

## 2019-01-03 MED ORDER — TRAZODONE HCL 50 MG PO TABS: 50.0000 mg | ORAL_TABLET | Freq: Every evening | ORAL | 1 refills | Status: AC | PRN

## 2019-01-03 MED ORDER — LAMOTRIGINE 25 MG PO TABS: 25.0000 mg | ORAL_TABLET | Freq: Every day | ORAL | 1 refills | Status: AC

## 2019-01-03 NOTE — Progress Notes (Signed)
Virtual Visit via Video Note  I connected with Connie Guzman on 01/03/19 at  2:00 PM EDT by a video enabled telemedicine application and verified that I am speaking with the correct person using two identifiers.   I discussed the limitations of evaluation and management by telemedicine and the availability of in person appointments. The patient expressed understanding and agreed to proceed. I discussed the assessment and treatment plan with the patient. The patient was provided an opportunity to ask questions and all were answered. The patient agreed with the plan and demonstrated an understanding of the instructions.   The patient was advised to call back or seek an in-person evaluation if the symptoms worsen or if the condition fails to improve as anticipated.   BH MD OP Progress Note  01/03/2019 2:17 PM Connie Guzman  MRN:  [id number]  Chief Complaint:  Chief Complaint    Follow-up     HPI: Connie Guzman is a 28 year old African-American female, employed, separated, currently living in [place], has a history of bipolar disorder, GAD, cannabis abuse, alcohol abuse was evaluated by telemedicine today.  Video call was initiated however it had to be changed to a phone call due to connection problem during the session.  Patient today reports that she recently relocated to [facility] and has a place for herself.  She reports she is trying to help out 2 of her other friends who are currently living with her.  And that is not for too long either.  She reports work is going well.  Patient reports she has been having some mood lability.  She feels depressed and has had a few crying spells.  She reports she is planning a lot of events in the near future.  She is going to complete 5 years at her job and that anniversary is coming up.  She also reports that her birthday coming up and she has planned to get away with her friends.  All this is kind of making her more overwhelmed.  She  reports she continues to take Abilify.  However previously when the Abilify dosage was increased she had a headache.  Discussed adding Lamictal.  She agrees with plan.  Patient denies any suicidality.  She has been cutting back on her cannabis.  She reports she continues to stay away from alcohol.  She continues to be in therapy sessions with Ms. Nolon Rod which is going well.  She denies any other concerns today. Visit Diagnosis:    ICD-10-CM   1. Bipolar 2 disorder (HCC)  F31.81 ARIPiprazole (ABILIFY) 5 MG tablet    lamoTRIgine (LAMICTAL) 25 MG tablet   mixed , mild  2. GAD (generalized anxiety disorder)  F41.1 lamoTRIgine (LAMICTAL) 25 MG tablet    citalopram (CELEXA) 10 MG tablet    traZODone (DESYREL) 50 MG tablet  3. Cannabis use disorder, mild, in early remission  F12.11   4. Mild alcohol abuse  F10.10     Past Psychiatric History: I have reviewed past psychiatric history from my progress note on 06/29/2017  Past Medical History:  Past Medical History:  Diagnosis Date  . Abnormal Pap smear of cervix   . Anemia   . Anxiety   . Chlamydia   . Depression   . Dysmenorrhea   . Heart murmur   . Inflammation of cervix     Past Surgical History:  Procedure Laterality Date  . LEEP    . TONSILLECTOMY AND ADENOIDECTOMY  2009  . tubes in ears    .  WISDOM TOOTH EXTRACTION  2012    Family Psychiatric History: Reviewed family psychiatric history from my progress note on 06/29/2017  Family History:  Family History  Problem Relation Age of Onset  . Breast cancer Mother   . Hypertension Mother   . Bipolar disorder Mother   . Alcohol abuse Father   . Breast cancer Maternal Grandmother   . Diabetes Maternal Grandmother   . Hyperlipidemia Maternal Grandmother   . Hypertension Maternal Grandmother   . Other Maternal Grandmother        fibroid uterus  . Bipolar disorder Sister   . Diabetes Sister        Borderline  . Anxiety disorder Sister   . Depression Sister   .  Bipolar disorder Sister   . Anxiety disorder Sister   . Depression Sister   . Alcohol abuse Maternal Uncle   . Anxiety disorder Sister     Social History: Reviewed social history from my progress note on 06/29/2017 Social History   Socioeconomic History  . Marital status: Married    Spouse name: Connie Guzman  . Number of children: 0  . Years of education: Not on file  . Highest education level: Associate degree: occupational, Hotel manager, or vocational program  Occupational History    Comment: full time  Social Needs  . Financial resource strain: Somewhat hard  . Food insecurity    Worry: Often true    Inability: Often true  . Transportation needs    Medical: Yes    Non-medical: Yes  Tobacco Use  . Smoking status: Never Smoker  . Smokeless tobacco: Never Used  Substance and Sexual Activity  . Alcohol use: Yes    Alcohol/week: 0.0 - 1.0 standard drinks    Comment: Rarely  . Drug use: Yes    Types: Marijuana    Comment: last used 2 days ago   . Sexual activity: Yes    Partners: Male    Birth control/protection: None  Lifestyle  . Physical activity    Days per week: 2 days    Minutes per session: 60 min  . Stress: Very much  Relationships  . Social Herbalist on phone: More than three times a week    Gets together: Never    Attends religious service: Never    Active member of club or organization: No    Attends meetings of clubs or organizations: Never    Relationship status: Married  Other Topics Concern  . Not on file  Social History Narrative  . Not on file    Allergies:  Allergies  Allergen Reactions  . Chocolate Anaphylaxis, Hives and Other (See Comments)    Migraines and dizziness     Metabolic Disorder Labs: Lab Results  Component Value Date   HGBA1C 4.8 11/21/2016   Lab Results  Component Value Date   PROLACTIN 11.4 08/06/2012   Lab Results  Component Value Date   CHOL 109 11/21/2016   TRIG 50 11/21/2016   HDL 49 11/21/2016    LDLCALC 50 11/21/2016   Lab Results  Component Value Date   TSH 0.625 11/21/2016   TSH 0.913 12/10/2014    Therapeutic Level Labs: No results found for: LITHIUM No results found for: VALPROATE No components found for:  CBMZ  Current Medications: Current Outpatient Medications  Medication Sig Dispense Refill  . acetaminophen (TYLENOL) 500 MG tablet Take 500-1,000 mg by mouth every 6 (six) hours as needed (for pain or headaches).     Marland Kitchen  ARIPiprazole (ABILIFY) 5 MG tablet Take 1 tablet (5 mg total) by mouth daily. 30 tablet 1  . aspirin-acetaminophen-caffeine (EXCEDRIN MIGRAINE) 250-250-65 MG tablet Take 2 tablets by mouth every 6 (six) hours as needed for headache or migraine.    . citalopram (CELEXA) 10 MG tablet Take 1 tablet (10 mg total) by mouth daily. 30 tablet 1  . fexofenadine (ALLEGRA) 180 MG tablet Take 180 mg by mouth daily as needed for allergies or rhinitis.    . fluticasone (FLONASE) 50 MCG/ACT nasal spray Place 2 sprays into both nostrils daily as needed for allergies or rhinitis.    . hydrOXYzine (ATARAX/VISTARIL) 25 MG tablet Take 1 tablet (25 mg total) by mouth 3 (three) times daily as needed. 90 tablet 2  . lamoTRIgine (LAMICTAL) 25 MG tablet Take 1 tablet (25 mg total) by mouth daily. 30 tablet 1  . ondansetron (ZOFRAN-ODT) 4 MG disintegrating tablet Take 1 tablet (4 mg total) by mouth every 8 (eight) hours as needed for nausea or vomiting. 20 tablet 0  . traZODone (DESYREL) 50 MG tablet Take 1 tablet (50 mg total) by mouth at bedtime as needed for sleep. 30 tablet 1   No current facility-administered medications for this visit.      Musculoskeletal: Strength & Muscle Tone: UTA Gait & Station: Reports as WNL Patient leans: N/A  Psychiatric Specialty Exam: Review of Systems  Psychiatric/Behavioral:       Mood lability  All other systems reviewed and are negative.   There were no vitals taken for this visit.There is no height or weight on file to calculate  BMI.  General Appearance: UTA  Eye Contact:  UTA  Speech:  Clear and Coherent  Volume:  Normal  Mood:  mood lability  Affect:  UTA  Thought Process:  Goal Directed and Descriptions of Associations: Intact  Orientation:  Full (Time, Place, and Person)  Thought Content: Logical   Suicidal Thoughts:  No  Homicidal Thoughts:  No  Memory:  Immediate;   Fair Recent;   Fair Remote;   Fair  Judgement:  Fair  Insight:  Fair  Psychomotor Activity:  UTA  Concentration:  Concentration: Fair and Attention Span: Fair  Recall:  [employer] of Knowledge: Fair  Language: Fair  Akathisia:  No  Handed:  Right  AIMS (if indicated): Denies tremors, rigidity  Assets:  Communication Skills Desire for Improvement Housing Social Support  ADL's:  Intact  Cognition: WNL  Sleep:  Fair   Screenings: PHQ2-9     [facility] Visit from 06/02/2017 in [facility] Office Visit from 11/20/2016 in [facility]  PHQ-2 Total Score  3  4  PHQ-9 Total Score  13  15       Assessment and Plan: Lillith is a 28 year old African-American female who has a history of bipolar disorder, cannabis use disorder, alcohol abuse episodic was evaluated by telemedicine today.  Patient is biologically predisposed given her family history of mental health problems as well as her own substance abuse problems.  She also has psychosocial stressors of relationship struggles , lack of social support.  Patient continues to struggle with mood symptoms and will benefit from medication readjustment.  Plan Bipolar disorder type II- some progress Abilify 5 mg p.o. daily Celexa 10 mg p.o. daily Start Lamictal 25 mg p.o. daily.  Provided medication education. Continue psychotherapy sessions with Ms. Peacock  Anxiety disorder-improving Hydroxyzine as prescribed Celexa 10 mg p.o. daily  Insomnia-improving Trazodone 50 mg p.o. nightly as needed  Cannabis use disorder mild-improving Provided substance abuse  counseling  Alcohol use disorder mild-improving She is currently working on cutting back.  Provided substance abuse counseling.   Follow-up in clinic in 4 weeks or sooner if needed.  November 9 at 2:20 PM  I have spent atleast 15 minutes non  face to face with patient today. More than 50 % of the time was spent for psychoeducation and supportive psychotherapy and care coordination. This note was generated in part or whole with voice recognition software. Voice recognition is usually quite accurate but there are transcription errors that can and very often do occur. I apologize for any typographical errors that were not detected and corrected.        Jomarie LongsSaramma Tauri Ethington, MD 01/03/2019, 2:17 PM

## 2019-01-06 ENCOUNTER — Other Ambulatory Visit: Payer: Self-pay

## 2019-01-06 ENCOUNTER — Ambulatory Visit: Payer: Managed Care, Other (non HMO) | Admitting: Licensed Clinical Social Worker

## 2019-01-13 ENCOUNTER — Ambulatory Visit: Payer: Managed Care, Other (non HMO) | Admitting: Licensed Clinical Social Worker

## 2019-01-13 ENCOUNTER — Other Ambulatory Visit: Payer: Self-pay

## 2019-01-20 IMAGING — RF DG HYSTEROGRAM
1 series · 1 of 1 positions shown · IV contrast (omnipaque)
Comparison: None in PACs

CLINICAL DATA: Infertility workup.

EXAM:
HYSTEROSALPINGOGRAM
TECHNIQUE: Following cleansing of the cervix and vagina with Betadine solution,
a hysterosalpingogram was performed using a 5-French
hysterosalpingogram catheter and Omnipaque 300 contrast. The patient
tolerated the examination without difficulty.

[Series 1: [id number]_[id number] · 0.17mm/px · 1 of 1 slices shown]
[im 1/1]
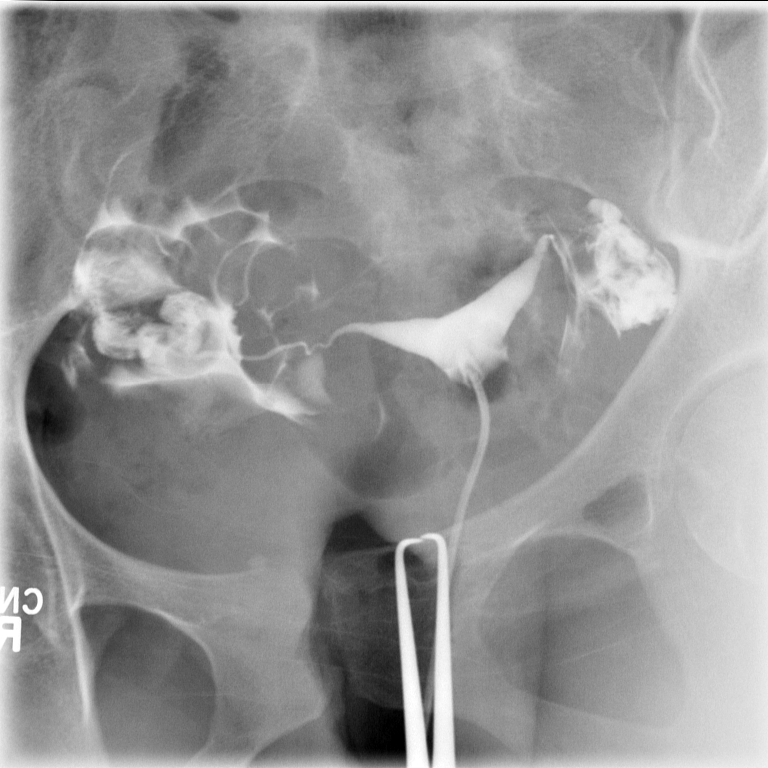

[1 of 1 positions shown; findings below may reference images not displayed]

FLUOROSCOPY TIME:  Radiation Exposure Index (as provided by the
fluoroscopic device): 224 micro Gy per meter squared

If the device does not provide the exposure index:

Fluoroscopy Time:  0 minutes, 54 seconds.

Number of Acquired Images:  1
FINDINGS: The cervix was cannulated by Dr. Padam. Contrast was then instilled
into the endometrial cavity. There was prompt spillage of the
fallopian tubes. Free spillage occurred relatively quickly on the
right. Free spillage ultimately occurred on the left but was
delayed.
IMPRESSION: Bilaterally patent fallopian tubes.  Normal contour of the uterus.

## 2019-02-07 ENCOUNTER — Ambulatory Visit (INDEPENDENT_AMBULATORY_CARE_PROVIDER_SITE_OTHER): Payer: Managed Care, Other (non HMO) | Admitting: Psychiatry

## 2019-02-07 ENCOUNTER — Other Ambulatory Visit: Payer: Self-pay

## 2019-02-07 DIAGNOSIS — Z91199 Patient's noncompliance with other medical treatment and regimen due to unspecified reason: Secondary | ICD-10-CM | POA: Insufficient documentation

## 2019-02-07 DIAGNOSIS — Z5329 Procedure and treatment not carried out because of patient's decision for other reasons: Secondary | ICD-10-CM | POA: Insufficient documentation

## 2019-02-07 NOTE — Progress Notes (Signed)
No response to call or text. 

## 2019-03-17 ENCOUNTER — Other Ambulatory Visit: Payer: Self-pay | Admitting: Cardiology

## 2019-03-17 DIAGNOSIS — Z20822 Contact with and (suspected) exposure to covid-19: Secondary | ICD-10-CM

## 2019-03-18 LAB — NOVEL CORONAVIRUS, NAA: SARS-CoV-2, NAA: NOT DETECTED

## 2019-03-21 ENCOUNTER — Ambulatory Visit: Payer: Managed Care, Other (non HMO) | Attending: Internal Medicine

## 2019-07-04 ENCOUNTER — Encounter: Payer: Self-pay | Admitting: Emergency Medicine

## 2019-07-04 ENCOUNTER — Ambulatory Visit
Admission: EM | Admit: 2019-07-04 | Discharge: 2019-07-04 | Disposition: A | Discharge status: Home / Self Care | Payer: 59 | Attending: Emergency Medicine | Admitting: Emergency Medicine

## 2019-07-04 ENCOUNTER — Other Ambulatory Visit: Payer: Self-pay

## 2019-07-04 DIAGNOSIS — L03011 Cellulitis of right finger: Secondary | ICD-10-CM | POA: Diagnosis not present

## 2019-07-04 MED ORDER — DOXYCYCLINE HYCLATE 100 MG PO CAPS: 100.0000 mg | ORAL_CAPSULE | Freq: Two times a day (BID) | ORAL | 0 refills | Status: AC

## 2019-07-04 NOTE — ED Triage Notes (Signed)
Reports having finger nails done professionally last week on Tuesday, 06/28/2019  Started feeling soreness in right little finger and side of hand a day later.   Yesterday noticed pus under right nail.

## 2019-07-04 NOTE — Discharge Instructions (Signed)
Keep area(s) clean and dry. °Apply hot compress / towel for 5-10 minutes 3-5 times daily. °Take antibiotic as prescribed with food - important to complete course. °Return for worsening pain, redness, swelling, discharge, fever. ° °Helpful prevention tips: °Keep nails short to avoid secondary skin infections. °Use new, clean razors when shaving. °Avoid antiperspirants - look for deodorants without aluminum. °Avoid wearing underwire bras as this can irritate the area further.  °

## 2019-07-04 NOTE — ED Provider Notes (Signed)
[facility]    CSN: [id number] Arrival date & time: 07/04/19  1448      History   Chief Complaint Chief Complaint  Patient presents with  . Finger Injury    HPI Connie Guzman is a 29 y.o. female presenting for evaluation of right pinky finger pain, soreness, swelling.  Patient had nails done on 06/28/2019.  States next days when she noticed soreness.  Patient noted pus under her nail yesterday.  Patient denying fever, chills.    Past Medical History:  Diagnosis Date  . Abnormal Pap smear of cervix   . Anemia   . Anxiety   . Chlamydia   . Depression   . Dysmenorrhea   . Heart murmur   . Inflammation of cervix     Patient Active Problem List   Diagnosis Date Noted  . No-show for appointment 02/07/2019  . Mild alcohol abuse 11/08/2018  . Bipolar 2 disorder (HCC) 10/08/2018  . GAD (generalized anxiety disorder) 10/08/2018  . Cannabis use disorder, mild, in early remission 10/08/2018  . Moderate recurrent major depression (HCC) 04/07/2017  . PTSD (post-traumatic stress disorder) 04/07/2017  . Chronic low back pain 10/11/2013  . Irregular menstrual cycle 08/06/2012  . Inflammation of cervix   . History of vitamin D deficiency 06/20/2010  . Dysmenorrhea 06/18/2010    Past Surgical History:  Procedure Laterality Date  . LEEP    . TONSILLECTOMY AND ADENOIDECTOMY  2009  . tubes in ears    . WISDOM TOOTH EXTRACTION  2012    OB History    Gravida  0   Para      Term      Preterm      AB      Living  0     SAB      TAB      Ectopic      Multiple      Live Births               Home Medications    Prior to Admission medications   Medication Sig Start Date End Date Taking? Authorizing Provider  acetaminophen (TYLENOL) 500 MG tablet Take 500-1,000 mg by mouth every 6 (six) hours as needed (for pain or headaches).     [provider]  ARIPiprazole (ABILIFY) 5 MG tablet Take 1 tablet (5 mg total) by mouth daily.  01/03/19   Jomarie Longs, MD  aspirin-acetaminophen-caffeine (EXCEDRIN MIGRAINE) [phone] MG tablet Take 2 tablets by mouth every 6 (six) hours as needed for headache or migraine.    [provider]  citalopram (CELEXA) 10 MG tablet Take 1 tablet (10 mg total) by mouth daily. 01/03/19   Jomarie Longs, MD  doxycycline (VIBRAMYCIN) 100 MG capsule Take 1 capsule (100 mg total) by mouth 2 (two) times daily for 5 days. 07/04/19 07/09/19  Hall-Potvin, [place], PA-C  fexofenadine (ALLEGRA) 180 MG tablet Take 180 mg by mouth daily as needed for allergies or rhinitis.    [provider]  fluticasone (FLONASE) 50 MCG/ACT nasal spray Place 2 sprays into both nostrils daily as needed for allergies or rhinitis.    [provider]  hydrOXYzine (ATARAX/VISTARIL) 25 MG tablet Take 1 tablet (25 mg total) by mouth 3 (three) times daily as needed. 07/01/18   Jomarie Longs, MD  lamoTRIgine (LAMICTAL) 25 MG tablet Take 1 tablet (25 mg total) by mouth daily. 01/03/19   Jomarie Longs, MD  ondansetron (ZOFRAN-ODT) 4 MG disintegrating tablet Take 1 tablet (  4 mg total) by mouth every 8 (eight) hours as needed for nausea or vomiting. 03/03/17   Trinna Post, PA-C  traZODone (DESYREL) 50 MG tablet Take 1 tablet (50 mg total) by mouth at bedtime as needed for sleep. 01/03/19   Ursula Alert, MD    Family History Family History  Problem Relation Age of Onset  . Breast cancer Mother   . Hypertension Mother   . Bipolar disorder Mother   . Alcohol abuse Father   . Breast cancer Maternal Grandmother   . Diabetes Maternal Grandmother   . Hyperlipidemia Maternal Grandmother   . Hypertension Maternal Grandmother   . Other Maternal Grandmother        fibroid uterus  . Bipolar disorder Sister   . Diabetes Sister        Borderline  . Anxiety disorder Sister   . Depression Sister   . Bipolar disorder Sister   . Anxiety disorder Sister   . Depression Sister   . Alcohol abuse Maternal Uncle    . Anxiety disorder Sister     Social History Social History   Tobacco Use  . Smoking status: Never Smoker  . Smokeless tobacco: Never Used  Substance Use Topics  . Alcohol use: Yes    Alcohol/week: 0.0 - 1.0 standard drinks    Comment: Rarely  . Drug use: Yes    Types: Marijuana    Comment: last used 2 days ago      Allergies   Chocolate   Review of Systems As per HPI   Physical Exam Triage Vital Signs ED Triage Vitals  Enc Vitals Group     BP 07/04/19 1503 106/70     Pulse Rate 07/04/19 1503 89     Resp 07/04/19 1503 16     Temp 07/04/19 1503 98.3 F (36.8 C)     Temp Source 07/04/19 1503 Oral     SpO2 07/04/19 1503 98 %     Weight --      Height --      Head Circumference --      Peak Flow --      Pain Score 07/04/19 1507 9     Pain Loc --      Pain Edu? --      Excl. in [place]? --    No data found.  Updated Vital Signs BP 106/70 (BP Location: Left Arm)   Pulse 89   Temp 98.3 F (36.8 C) (Oral)   Resp 16   LMP 06/28/2019   SpO2 98%   Visual Acuity Right Eye Distance:   Left Eye Distance:   Bilateral Distance:    Right Eye Near:   Left Eye Near:    Bilateral Near:     Physical Exam Constitutional:      General: She is not in acute distress. HENT:     Head: Normocephalic and atraumatic.  Eyes:     General: No scleral icterus.    Pupils: Pupils are equal, round, and reactive to light.  Cardiovascular:     Rate and Rhythm: Normal rate.  Pulmonary:     Effort: Pulmonary effort is normal.  Musculoskeletal:        General: Swelling and tenderness present. Normal range of motion.     Comments: Right fifth digit, distal aspect  Skin:    Coloration: Skin is not jaundiced or pale.     Comments: Inconsistent with normal paronychia.  And was appears blisterlike.  Some thin, purulent discharge able  to be milked.  Area is tender, though does not surround nailbed on dorsal aspect, more on ventral side.  Neurological:     Mental Status: She is  alert and oriented to person, place, and time.      UC Treatments / Results  Labs (all labs ordered are listed, but only abnormal results are displayed) Labs Reviewed - No data to display  EKG   Radiology No results found.  Procedures Procedures (including critical care time)  Medications Ordered in UC Medications - No data to display  Initial Impression / Assessment and Plan / UC Course  I have reviewed the triage vital signs and the nursing notes.  Pertinent labs & imaging results that were available during my care of the patient were reviewed by me and considered in my medical decision making (see chart for details).     H&P consistent with what I believed to be paronychia, though atypical presentation.  Given there is no focal lesion/fluctuance, will defer I&D today.  Trial antibiotics, patient follow-up with PCP.  Reviewed additional supportive measures as outlined below.  Return precautions discussed, patient verbalized understanding and is agreeable to plan. Final Clinical Impressions(s) / UC Diagnoses   Final diagnoses:  Paronychia of right little finger     Discharge Instructions     Keep area(s) clean and dry. Apply hot compress / towel for 5-10 minutes 3-5 times daily. Take antibiotic as prescribed with food - important to complete course. Return for worsening pain, redness, swelling, discharge, fever.  Helpful prevention tips: Keep nails short to avoid secondary skin infections. Use new, clean razors when shaving. Avoid antiperspirants - look for deodorants without aluminum. Avoid wearing underwire bras as this can irritate the area further.     ED Prescriptions    Medication Sig Dispense Auth. Provider   doxycycline (VIBRAMYCIN) 100 MG capsule Take 1 capsule (100 mg total) by mouth 2 (two) times daily for 5 days. 10 capsule Hall-Potvin, [place], PA-C     PDMP not reviewed this encounter.   Hall-Potvin, [place], [place] 07/04/19 1550

## 2019-08-04 ENCOUNTER — Ambulatory Visit: Payer: 59 | Admitting: Physician Assistant

## 2019-08-04 NOTE — Progress Notes (Deleted)
     Established patient visit   Patient: Connie Guzman   DOB: May 03, 1990   29 y.o. Female  MRN: [id number] Visit Date: 08/04/2019  Today's healthcare provider: Trey Sailors, PA-C   No chief complaint on file.  Subjective    Exposure to STD  Pertinent negatives include no abdominal pain or fever.   ***  {Show patient history (optional):23778::" "}   Medications: Outpatient Medications Prior to Visit  Medication Sig  . acetaminophen (TYLENOL) 500 MG tablet Take 500-1,000 mg by mouth every 6 (six) hours as needed (for pain or headaches).   . ARIPiprazole (ABILIFY) 5 MG tablet Take 1 tablet (5 mg total) by mouth daily.  Marland Kitchen aspirin-acetaminophen-caffeine (EXCEDRIN MIGRAINE) 250-250-65 MG tablet Take 2 tablets by mouth every 6 (six) hours as needed for headache or migraine.  . citalopram (CELEXA) 10 MG tablet Take 1 tablet (10 mg total) by mouth daily.  . fexofenadine (ALLEGRA) 180 MG tablet Take 180 mg by mouth daily as needed for allergies or rhinitis.  . fluticasone (FLONASE) 50 MCG/ACT nasal spray Place 2 sprays into both nostrils daily as needed for allergies or rhinitis.  . hydrOXYzine (ATARAX/VISTARIL) 25 MG tablet Take 1 tablet (25 mg total) by mouth 3 (three) times daily as needed.  . lamoTRIgine (LAMICTAL) 25 MG tablet Take 1 tablet (25 mg total) by mouth daily.  . ondansetron (ZOFRAN-ODT) 4 MG disintegrating tablet Take 1 tablet (4 mg total) by mouth every 8 (eight) hours as needed for nausea or vomiting.  . traZODone (DESYREL) 50 MG tablet Take 1 tablet (50 mg total) by mouth at bedtime as needed for sleep.   No facility-administered medications prior to visit.    Review of Systems  Constitutional: Negative for appetite change, chills, fatigue and fever.  Respiratory: Negative for chest tightness and shortness of breath.   Cardiovascular: Negative for chest pain and palpitations.  Gastrointestinal: Negative for abdominal pain, nausea and vomiting.    Neurological: Negative for dizziness and weakness.    {Show previous labs (optional):23779::" "}  Objective    There were no vitals taken for this visit. {Show previous vital signs (optional):23777::" "}  Physical Exam  ***  No results found for any visits on 08/04/19.  Assessment & Plan     ***  No follow-ups on file.      {provider attestation***:1}   Maryella Shivers  [facility] [phone] (phone) [phone] (fax)  [facility]

## 2019-08-09 ENCOUNTER — Ambulatory Visit: Payer: 59 | Admitting: Physician Assistant

## 2019-08-10 NOTE — Progress Notes (Signed)
Established patient visit   Patient: Connie Guzman   DOB: 11/04/1990   29 y.o. Female  MRN: [id number] Visit Date: 08/11/2019  Today's healthcare provider: Trinna Post, PA-C   Chief Complaint  Patient presents with  . Anxiety  . Depression  . STD testing   Mertie Moores as a scribe for Trinna Post, PA-C.,have documented all relevant documentation on the behalf of Trinna Post, PA-C,as directed by  Trinna Post, PA-C while in the presence of Trinna Post, PA-C.  Subjective    HPI Anxiety, Follow-up  She was last seen for anxiety 12/2018  Changes made at last visit include no change.   She reports poor compliance with treatment. She reports poor tolerance of treatment. She is having side effects.   She feels her anxiety is severe and Unchanged since last visit.  Symptoms: No chest pain Yes difficulty concentrating  No dizziness No fatigue  No feelings of losing control Yes insomnia  Yes irritable No palpitations  No panic attacks Yes racing thoughts  No shortness of breath No sweating  No tremors/shakes    GAD-7 Results GAD-7 Generalized Anxiety Disorder Screening Tool 08/11/2019  1. Feeling Nervous, Anxious, or on Edge 2  2. Not Being Able to Stop or Control Worrying 3  3. Worrying Too Much About Different Things 2  4. Trouble Relaxing 2  5. Being So Restless it's Hard To Sit Still 2  6. Becoming Easily Annoyed or Irritable 3  7. Feeling Afraid As If Something Awful Might Happen 1  Total GAD-7 Score 15  Difficulty At Work, Home, or Getting  Along With Others? Somewhat difficult    PHQ-9 Scores PHQ9 SCORE ONLY 08/11/2019 06/02/2017 11/20/2016  Score 13 13 15    Depression, Follow-up   She  was last seen for this 12/2018 by psychiatry. She was on Celexa, Lamictal, and Abilify  Changes made at last visit include no change.   She reports poor compliance with treatment. Patient has not been taking medications regularly  or following up with Psychiatry. Patient reports medication side effects include nausea. She has tried taking medications at different times of the day with no relief.  She is having side effects. Nausea   She reports poor tolerance of treatment. Current symptoms include: depressed mood, insomnia, psychomotor agitation and weight gain She feels she is Unchanged since last visit.  Depression screen [facility] 2/9 08/11/2019 06/02/2017 11/20/2016  Decreased Interest 2 2 2   Down, Depressed, Hopeless 2 1 2   PHQ - 2 Score 4 3 4   Altered sleeping 2 3 3   Tired, decreased energy 2 2 3   Change in appetite 2 3 1   Feeling bad or failure about yourself  1 1 2   Trouble concentrating 1 1 1   Moving slowly or fidgety/restless 1 0 0  Suicidal thoughts 0 0 1  PHQ-9 Score 13 13 15   Difficult doing work/chores Somewhat difficult Very difficult Somewhat difficult    -----------------------------------------------------------------------------------------  Patient is requesting STD testing. She states she just wants to be tested because she started dating a new partner. Patient denies any symptoms.  ---------------------------------------------------------------------------------------------------     Medications: Outpatient Medications Prior to Visit  Medication Sig  . acetaminophen (TYLENOL) 500 MG tablet Take 500-1,000 mg by mouth every 6 (six) hours as needed (for pain or headaches).   . ARIPiprazole (ABILIFY) 5 MG tablet Take 1 tablet (5 mg total) by mouth daily.  Marland Kitchen aspirin-acetaminophen-caffeine (EXCEDRIN MIGRAINE) 250-250-65 MG tablet Take 2  tablets by mouth every 6 (six) hours as needed for headache or migraine.  . citalopram (CELEXA) 10 MG tablet Take 1 tablet (10 mg total) by mouth daily.  . fluticasone (FLONASE) 50 MCG/ACT nasal spray Place 2 sprays into both nostrils daily as needed for allergies or rhinitis.  . hydrOXYzine (ATARAX/VISTARIL) 25 MG tablet Take 1 tablet (25 mg total) by mouth 3 (three)  times daily as needed.  . lamoTRIgine (LAMICTAL) 25 MG tablet Take 1 tablet (25 mg total) by mouth daily.  Marland Kitchen loratadine (CLARITIN) 10 MG tablet Take 10 mg by mouth daily.  . ondansetron (ZOFRAN-ODT) 4 MG disintegrating tablet Take 1 tablet (4 mg total) by mouth every 8 (eight) hours as needed for nausea or vomiting.  . traZODone (DESYREL) 50 MG tablet Take 1 tablet (50 mg total) by mouth at bedtime as needed for sleep.  . [DISCONTINUED] fexofenadine (ALLEGRA) 180 MG tablet Take 180 mg by mouth daily as needed for allergies or rhinitis.   No facility-administered medications prior to visit.    Review of Systems  Constitutional: Negative.   Respiratory: Negative.   Cardiovascular: Negative.   Genitourinary: Negative.   Musculoskeletal: Negative.   Psychiatric/Behavioral: The patient is nervous/anxious.        Depression       Objective    BP 101/67 (BP Location: Right Arm, Patient Position: Sitting, Cuff Size: Normal)   Pulse 71   Temp (!) 97.1 F (36.2 C) (Temporal)   Wt 127 lb 3.2 oz (57.7 kg)   BMI 19.92 kg/m    Physical Exam Exam conducted with a chaperone present.  Constitutional:      Appearance: Normal appearance.  Cardiovascular:     Rate and Rhythm: Normal rate and regular rhythm.     Pulses: Normal pulses.     Heart sounds: Normal heart sounds.  Pulmonary:     Effort: Pulmonary effort is normal.     Breath sounds: Normal breath sounds.  Genitourinary:    General: Normal vulva.     Vagina: Normal.     Cervix: Normal.  Skin:    General: Skin is warm and dry.  Neurological:     Mental Status: She is alert and oriented to person, place, and time. Mental status is at baseline.  Psychiatric:        Mood and Affect: Mood normal.        Behavior: Behavior normal.     No results found for any visits on 08/11/19.  Assessment & Plan    1. Anxiety and depression Uncontrolled. Patient advised to take medications consistently to help. Patient advised to follow up  with Dr. Elna Breslow to follow her medications.   2. Cervical cancer screening  - Cytology - PAP  3. Screen for STD (sexually transmitted disease) Patient denies any symptoms. Patient has a new partner and request screening test. - Cervicovaginal ancillary only   Return if symptoms worsen or fail to improve.      ITrey Sailors, PA-C, have reviewed all documentation for this visit. The documentation on 08/11/19 for the exam, diagnosis, procedures, and orders are all accurate and complete.    Maryella Shivers  [facility] [phone] (phone) [phone] (fax)  [facility]

## 2019-08-11 ENCOUNTER — Other Ambulatory Visit (HOSPITAL_COMMUNITY)
Admission: RE | Admit: 2019-08-11 | Discharge: 2019-08-11 | Disposition: A | Discharge status: Home / Self Care | Payer: 59 | Source: Ambulatory Visit | Attending: Physician Assistant | Admitting: Physician Assistant

## 2019-08-11 ENCOUNTER — Encounter: Payer: Self-pay | Admitting: Physician Assistant

## 2019-08-11 ENCOUNTER — Ambulatory Visit (INDEPENDENT_AMBULATORY_CARE_PROVIDER_SITE_OTHER): Payer: 59 | Admitting: Physician Assistant

## 2019-08-11 ENCOUNTER — Other Ambulatory Visit: Payer: Self-pay

## 2019-08-11 VITALS — BP 101/67 | HR 71 | Temp 97.1°F | Wt 127.2 lb

## 2019-08-11 DIAGNOSIS — F419 Anxiety disorder, unspecified: Secondary | ICD-10-CM

## 2019-08-11 DIAGNOSIS — F329 Major depressive disorder, single episode, unspecified: Secondary | ICD-10-CM

## 2019-08-11 DIAGNOSIS — F32A Depression, unspecified: Secondary | ICD-10-CM

## 2019-08-11 DIAGNOSIS — Z124 Encounter for screening for malignant neoplasm of cervix: Secondary | ICD-10-CM | POA: Diagnosis not present

## 2019-08-11 DIAGNOSIS — Z113 Encounter for screening for infections with a predominantly sexual mode of transmission: Secondary | ICD-10-CM | POA: Diagnosis not present

## 2019-08-11 DIAGNOSIS — N760 Acute vaginitis: Secondary | ICD-10-CM | POA: Diagnosis not present

## 2019-08-11 DIAGNOSIS — B3731 Acute candidiasis of vulva and vagina: Secondary | ICD-10-CM

## 2019-08-11 DIAGNOSIS — B9689 Other specified bacterial agents as the cause of diseases classified elsewhere: Secondary | ICD-10-CM

## 2019-08-11 DIAGNOSIS — B373 Candidiasis of vulva and vagina: Secondary | ICD-10-CM

## 2019-08-15 LAB — CERVICOVAGINAL ANCILLARY ONLY
Bacterial Vaginitis (gardnerella): POSITIVE — AB
Candida Glabrata: NEGATIVE
Candida Vaginitis: POSITIVE — AB
Chlamydia: NEGATIVE
Comment: NEGATIVE
Comment: NEGATIVE
Comment: NEGATIVE
Comment: NEGATIVE
Comment: NEGATIVE
Comment: NORMAL
Neisseria Gonorrhea: NEGATIVE
Trichomonas: NEGATIVE

## 2019-08-15 LAB — CYTOLOGY - PAP: Diagnosis: NEGATIVE

## 2019-08-15 MED ORDER — FLUCONAZOLE 150 MG PO TABS: ORAL_TABLET | ORAL | 0 refills | Status: AC

## 2019-08-15 MED ORDER — METRONIDAZOLE 500 MG PO TABS: 500.0000 mg | ORAL_TABLET | Freq: Two times a day (BID) | ORAL | 0 refills | Status: AC

## 2019-08-15 NOTE — Addendum Note (Signed)
Addended by: Trey Sailors on: 08/15/2019 04:04 PM   Modules accepted: Orders

## 2019-09-20 ENCOUNTER — Encounter: Payer: Self-pay | Admitting: Physician Assistant

## 2019-09-21 NOTE — Telephone Encounter (Signed)
Can schedule mychart video, she'd have to drop off or fax FMLA.

## 2019-09-23 ENCOUNTER — Telehealth (INDEPENDENT_AMBULATORY_CARE_PROVIDER_SITE_OTHER): Payer: 59 | Admitting: Physician Assistant

## 2019-09-23 ENCOUNTER — Telehealth: Payer: Self-pay | Admitting: Physician Assistant

## 2019-09-23 DIAGNOSIS — R519 Headache, unspecified: Secondary | ICD-10-CM | POA: Diagnosis not present

## 2019-09-23 DIAGNOSIS — S12101S Unspecified nondisplaced fracture of second cervical vertebra, sequela: Secondary | ICD-10-CM | POA: Diagnosis not present

## 2019-09-23 MED ORDER — HYDROCODONE-ACETAMINOPHEN 5-325 MG PO TABS: 1.0000 | ORAL_TABLET | Freq: Three times a day (TID) | ORAL | 0 refills | Status: AC
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
## 2019-09-23 NOTE — Telephone Encounter (Signed)
FMLA filled out, looks like patient will need to sign it. Ready for pick up.
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
## 2019-09-26 NOTE — Telephone Encounter (Signed)
Please review. Thanks!  

## 2019-09-30 ENCOUNTER — Telehealth: Payer: Self-pay | Admitting: Physician Assistant

## 2019-09-30 DIAGNOSIS — R110 Nausea: Secondary | ICD-10-CM

## 2019-09-30 DIAGNOSIS — S12101S Unspecified nondisplaced fracture of second cervical vertebra, sequela: Secondary | ICD-10-CM

## 2019-09-30 MED ORDER — HYDROCODONE-ACETAMINOPHEN 5-325 MG PO TABS: 1.0000 | ORAL_TABLET | Freq: Three times a day (TID) | ORAL | 0 refills | Status: DC

## 2019-09-30 MED ORDER — ONDANSETRON 4 MG PO TBDP: 4.0000 mg | ORAL_TABLET | Freq: Three times a day (TID) | ORAL | 0 refills | Status: DC | PRN

## 2019-09-30 MED ORDER — ONDANSETRON 4 MG PO TBDP: 4.0000 mg | ORAL_TABLET | Freq: Three times a day (TID) | ORAL | 0 refills | Status: AC | PRN

## 2019-09-30 MED ORDER — HYDROCODONE-ACETAMINOPHEN 5-325 MG PO TABS: 1.0000 | ORAL_TABLET | Freq: Three times a day (TID) | ORAL | 0 refills | Status: AC

## 2019-09-30 NOTE — Telephone Encounter (Signed)
Medication Refill - Medication: Zofran and Hydrocodone  Has the patient contacted their pharmacy? No. (Agent: If no, request that the patient contact the pharmacy for the refill.) (Agent: If yes, when and what did the pharmacy advise?)  Preferred Pharmacy (with phone number or street name):[id number] - [place], [place] - [address] [place] [address] [facility]   Agent: Please be advised that RX refills may take up to 3 business days. We ask that you follow-up with your pharmacy.

## 2019-10-10 ENCOUNTER — Telehealth: Payer: Self-pay | Admitting: Physician Assistant

## 2019-10-10 NOTE — Telephone Encounter (Signed)
Does this need an OV? Please advise. Thanks!  

## 2019-10-10 NOTE — Telephone Encounter (Signed)
Patient requesting call from clinical staff to discuss neuro referral and PCP potentially filling RX for pain medication, as she states the last pain medication that was prescribed for her does not seem to be working and she states that she is still in a considerable amount of pain. Please advise.
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
## 2019-10-11 NOTE — Telephone Encounter (Signed)
Pt has not seen neurosurgery yet

## 2019-10-11 NOTE — Telephone Encounter (Signed)
FYI. Pt has scheduled an appt with you on 7/15 to discuss this.

## 2019-10-13 ENCOUNTER — Encounter: Payer: Self-pay | Admitting: Physician Assistant

## 2019-10-13 ENCOUNTER — Other Ambulatory Visit: Payer: Self-pay

## 2019-10-13 ENCOUNTER — Ambulatory Visit: Payer: 59 | Admitting: Physician Assistant

## 2019-10-13 ENCOUNTER — Telehealth: Payer: Self-pay

## 2019-10-13 VITALS — BP 104/76 | HR 82 | Temp 97.3°F | Wt 123.0 lb

## 2019-10-13 DIAGNOSIS — S12191S Other nondisplaced fracture of second cervical vertebra, sequela: Secondary | ICD-10-CM | POA: Diagnosis not present

## 2019-10-13 DIAGNOSIS — S12101S Unspecified nondisplaced fracture of second cervical vertebra, sequela: Secondary | ICD-10-CM

## 2019-10-13 MED ORDER — HYDROCODONE-ACETAMINOPHEN 5-325 MG PO TABS: 1.0000 | ORAL_TABLET | Freq: Four times a day (QID) | ORAL | 0 refills | Status: AC | PRN
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
## 2019-10-13 NOTE — Telephone Encounter (Signed)
Copied from CRM [id number]. Topic: General - Inquiry >> Oct 13, 2019  4:37 PM Deborha Payment wrote: Reason for CRM: Marcelino Duster from [facility] called stating she has a message for PCP she spoke with patient on June 29th at 2:30pm.  If any other questions to call back  Call back [phone], press 0 and ask for [employer]
